# Patient Record
Sex: Female | Born: 1955 | Race: Black or African American | Hispanic: No | Marital: Married | Smoking: Never smoker
Health system: Southern US, Community
[De-identification: ages and names within clinical notes are randomized; demographics above are authoritative.]

## PROBLEM LIST (undated history)

## (undated) DIAGNOSIS — M549 Dorsalgia, unspecified: Secondary | ICD-10-CM

## (undated) DIAGNOSIS — G709 Myoneural disorder, unspecified: Secondary | ICD-10-CM

## (undated) DIAGNOSIS — E785 Hyperlipidemia, unspecified: Secondary | ICD-10-CM

## (undated) DIAGNOSIS — M25511 Pain in right shoulder: Secondary | ICD-10-CM

## (undated) DIAGNOSIS — E041 Nontoxic single thyroid nodule: Secondary | ICD-10-CM

## (undated) DIAGNOSIS — R011 Cardiac murmur, unspecified: Secondary | ICD-10-CM

## (undated) DIAGNOSIS — M542 Cervicalgia: Secondary | ICD-10-CM

## (undated) DIAGNOSIS — M25512 Pain in left shoulder: Secondary | ICD-10-CM

## (undated) DIAGNOSIS — E119 Type 2 diabetes mellitus without complications: Secondary | ICD-10-CM

## (undated) DIAGNOSIS — I10 Essential (primary) hypertension: Secondary | ICD-10-CM

## (undated) HISTORY — DX: Pain in right shoulder: M25.511

## (undated) HISTORY — DX: Myoneural disorder, unspecified: G70.9

## (undated) HISTORY — DX: Cardiac murmur, unspecified: R01.1

## (undated) HISTORY — DX: Pain in right shoulder: M25.512

## (undated) HISTORY — PX: CHOLECYSTECTOMY: SHX55

## (undated) HISTORY — DX: Hyperlipidemia, unspecified: E78.5

## (undated) HISTORY — PX: ABDOMINAL HYSTERECTOMY: SHX81

## (undated) HISTORY — DX: Nontoxic single thyroid nodule: E04.1

## (undated) HISTORY — DX: Cervicalgia: M54.2

## (undated) HISTORY — DX: Dorsalgia, unspecified: M54.9

---

## 1999-05-03 ENCOUNTER — Ambulatory Visit: Admission: RE | Admit: 1999-05-03 | Discharge: 1999-05-03 | Payer: Self-pay | Admitting: Pulmonary Disease

## 2003-04-24 ENCOUNTER — Encounter: Payer: Self-pay | Admitting: *Deleted

## 2003-04-24 ENCOUNTER — Emergency Department (HOSPITAL_COMMUNITY): Admission: EM | Admit: 2003-04-24 | Discharge: 2003-04-24 | Payer: Self-pay | Admitting: *Deleted

## 2003-05-25 ENCOUNTER — Encounter (HOSPITAL_COMMUNITY): Admission: RE | Admit: 2003-05-25 | Discharge: 2003-06-24 | Payer: Self-pay | Admitting: Family Medicine

## 2003-06-25 ENCOUNTER — Encounter (HOSPITAL_COMMUNITY): Admission: RE | Admit: 2003-06-25 | Discharge: 2003-07-25 | Payer: Self-pay | Admitting: Family Medicine

## 2006-10-13 ENCOUNTER — Emergency Department (HOSPITAL_COMMUNITY): Admission: EM | Admit: 2006-10-13 | Discharge: 2006-10-13 | Payer: Self-pay | Admitting: Physician Assistant

## 2007-02-19 ENCOUNTER — Emergency Department (HOSPITAL_COMMUNITY): Admission: EM | Admit: 2007-02-19 | Discharge: 2007-02-19 | Payer: Self-pay | Admitting: Emergency Medicine

## 2007-12-05 ENCOUNTER — Ambulatory Visit (HOSPITAL_COMMUNITY): Admission: RE | Admit: 2007-12-05 | Discharge: 2007-12-05 | Payer: Self-pay | Admitting: Internal Medicine

## 2007-12-28 ENCOUNTER — Emergency Department (HOSPITAL_COMMUNITY): Admission: EM | Admit: 2007-12-28 | Discharge: 2007-12-28 | Payer: Self-pay | Admitting: Emergency Medicine

## 2008-03-27 ENCOUNTER — Ambulatory Visit (HOSPITAL_COMMUNITY): Admission: RE | Admit: 2008-03-27 | Discharge: 2008-03-27 | Payer: Self-pay | Admitting: Internal Medicine

## 2008-12-23 ENCOUNTER — Ambulatory Visit (HOSPITAL_COMMUNITY): Admission: RE | Admit: 2008-12-23 | Discharge: 2008-12-23 | Payer: Self-pay | Admitting: Internal Medicine

## 2011-05-30 LAB — DIFFERENTIAL
Basophils Absolute: 0
Basophils Relative: 0
Eosinophils Absolute: 0.1
Eosinophils Relative: 1
Lymphocytes Relative: 15
Lymphs Abs: 1.5
Monocytes Absolute: 0.8
Monocytes Relative: 8
Neutro Abs: 7.7
Neutrophils Relative %: 76

## 2011-05-30 LAB — CBC
HCT: 36.1
Hemoglobin: 12.1
MCHC: 33.7
MCV: 82.3
Platelets: 240
RBC: 4.39
RDW: 14.7
WBC: 10.1

## 2011-05-30 LAB — BASIC METABOLIC PANEL
BUN: 8
CO2: 26
Calcium: 9.2
Chloride: 101
Creatinine, Ser: 0.86
GFR calc Af Amer: >60
GFR calc non Af Amer: >60
Glucose, Bld: 111 — ABNORMAL HIGH
Potassium: 3.5
Sodium: 134 — ABNORMAL LOW

## 2011-05-30 LAB — CULTURE, BLOOD (ROUTINE X 2)
Culture: NO GROWTH
Culture: NO GROWTH
Report Status: 4302009
Report Status: 4302009

## 2011-06-21 LAB — CBC
HCT: 37.7
Hemoglobin: 12.7
MCHC: 33.7
MCV: 79.7
Platelets: 308
RBC: 4.73
RDW: 14.6 — ABNORMAL HIGH
WBC: 8.3

## 2011-06-21 LAB — DIFFERENTIAL
Basophils Absolute: 0.1
Basophils Relative: 1
Eosinophils Absolute: 0.2
Eosinophils Relative: 2
Lymphocytes Relative: 35
Lymphs Abs: 2.9
Monocytes Absolute: 0.6
Monocytes Relative: 7
Neutro Abs: 4.5
Neutrophils Relative %: 55

## 2011-06-21 LAB — BASIC METABOLIC PANEL
BUN: 8
CO2: 29
Calcium: 9.5
Chloride: 105
Creatinine, Ser: 0.85
GFR calc Af Amer: >60
GFR calc non Af Amer: >60
Glucose, Bld: 68 — ABNORMAL LOW
Potassium: 3.8
Sodium: 140

## 2011-06-21 LAB — POCT CARDIAC MARKERS
CKMB, poc: 1.3
Myoglobin, poc: 85.4
Operator id: 217071
Troponin i, poc: <0.05

## 2011-06-21 LAB — BLOOD GAS, ARTERIAL
Acid-Base Excess: 2.2 — ABNORMAL HIGH
Bicarbonate: 26.1 — ABNORMAL HIGH
FIO2: 21
O2 Content: 21
O2 Saturation: 94.6
Patient temperature: 37
TCO2: 23.3
pCO2 arterial: 39.8
pH, Arterial: 7.432 — ABNORMAL HIGH
pO2, Arterial: 70.2 — ABNORMAL LOW

## 2011-06-21 LAB — D-DIMER, QUANTITATIVE: D-Dimer, Quant: 0.7 — ABNORMAL HIGH

## 2013-01-24 ENCOUNTER — Ambulatory Visit: Payer: Self-pay | Admitting: Physician Assistant

## 2013-05-14 ENCOUNTER — Other Ambulatory Visit (HOSPITAL_COMMUNITY): Payer: Self-pay | Admitting: Internal Medicine

## 2013-05-14 DIAGNOSIS — Z139 Encounter for screening, unspecified: Secondary | ICD-10-CM

## 2013-05-20 ENCOUNTER — Ambulatory Visit (HOSPITAL_COMMUNITY)
Admission: RE | Admit: 2013-05-20 | Discharge: 2013-05-20 | Disposition: A | Discharge status: Home / Self Care | Payer: BC Managed Care – PPO | Source: Ambulatory Visit | Attending: Internal Medicine | Admitting: Internal Medicine

## 2013-05-20 DIAGNOSIS — Z139 Encounter for screening, unspecified: Secondary | ICD-10-CM

## 2013-05-20 DIAGNOSIS — Z1231 Encounter for screening mammogram for malignant neoplasm of breast: Secondary | ICD-10-CM | POA: Insufficient documentation

## 2016-01-17 ENCOUNTER — Other Ambulatory Visit: Payer: Self-pay | Admitting: Registered Nurse

## 2016-01-17 DIAGNOSIS — Z1231 Encounter for screening mammogram for malignant neoplasm of breast: Secondary | ICD-10-CM

## 2016-02-04 ENCOUNTER — Ambulatory Visit (HOSPITAL_COMMUNITY)
Admission: RE | Admit: 2016-02-04 | Discharge: 2016-02-04 | Disposition: A | Discharge status: Home / Self Care | Payer: BLUE CROSS/BLUE SHIELD | Source: Ambulatory Visit | Attending: Registered Nurse | Admitting: Registered Nurse

## 2016-02-04 DIAGNOSIS — Z1231 Encounter for screening mammogram for malignant neoplasm of breast: Secondary | ICD-10-CM | POA: Insufficient documentation

## 2016-11-30 ENCOUNTER — Emergency Department (HOSPITAL_COMMUNITY): Payer: BLUE CROSS/BLUE SHIELD

## 2016-11-30 ENCOUNTER — Encounter (HOSPITAL_COMMUNITY): Payer: Self-pay

## 2016-11-30 ENCOUNTER — Emergency Department (HOSPITAL_COMMUNITY)
Admission: EM | Admit: 2016-11-30 | Discharge: 2016-11-30 | Disposition: A | Discharge status: Home / Self Care | Payer: BLUE CROSS/BLUE SHIELD | Attending: Emergency Medicine | Admitting: Emergency Medicine

## 2016-11-30 DIAGNOSIS — S161XXA Strain of muscle, fascia and tendon at neck level, initial encounter: Secondary | ICD-10-CM | POA: Diagnosis not present

## 2016-11-30 DIAGNOSIS — E119 Type 2 diabetes mellitus without complications: Secondary | ICD-10-CM | POA: Diagnosis not present

## 2016-11-30 DIAGNOSIS — Z7984 Long term (current) use of oral hypoglycemic drugs: Secondary | ICD-10-CM | POA: Diagnosis not present

## 2016-11-30 DIAGNOSIS — S0990XA Unspecified injury of head, initial encounter: Secondary | ICD-10-CM

## 2016-11-30 DIAGNOSIS — Y92410 Unspecified street and highway as the place of occurrence of the external cause: Secondary | ICD-10-CM | POA: Diagnosis not present

## 2016-11-30 DIAGNOSIS — I10 Essential (primary) hypertension: Secondary | ICD-10-CM | POA: Insufficient documentation

## 2016-11-30 DIAGNOSIS — Y939 Activity, unspecified: Secondary | ICD-10-CM | POA: Diagnosis not present

## 2016-11-30 DIAGNOSIS — Y999 Unspecified external cause status: Secondary | ICD-10-CM | POA: Insufficient documentation

## 2016-11-30 DIAGNOSIS — S199XXA Unspecified injury of neck, initial encounter: Secondary | ICD-10-CM | POA: Diagnosis present

## 2016-11-30 DIAGNOSIS — Z79899 Other long term (current) drug therapy: Secondary | ICD-10-CM | POA: Insufficient documentation

## 2016-11-30 HISTORY — DX: Essential (primary) hypertension: I10

## 2016-11-30 HISTORY — DX: Type 2 diabetes mellitus without complications: E11.9

## 2016-11-30 LAB — CBC WITH DIFFERENTIAL/PLATELET
Basophils Absolute: 0 10*3/uL (ref 0.0–0.1)
Basophils Relative: 0 %
Eosinophils Absolute: 0.2 10*3/uL (ref 0.0–0.7)
Eosinophils Relative: 2 %
HEMATOCRIT: 35.2 % — AB (ref 36.0–46.0)
Hemoglobin: 11.8 g/dL — ABNORMAL LOW (ref 12.0–15.0)
LYMPHS PCT: 31 %
Lymphs Abs: 2.6 10*3/uL (ref 0.7–4.0)
MCH: 27.4 pg (ref 26.0–34.0)
MCHC: 33.5 g/dL (ref 30.0–36.0)
MCV: 81.9 fL (ref 78.0–100.0)
MONO ABS: 0.8 10*3/uL (ref 0.1–1.0)
Monocytes Relative: 9 %
NEUTROS ABS: 4.7 10*3/uL (ref 1.7–7.7)
Neutrophils Relative %: 58 %
Platelets: 281 10*3/uL (ref 150–400)
RBC: 4.3 MIL/uL (ref 3.87–5.11)
RDW: 13.5 % (ref 11.5–15.5)
WBC: 8.3 10*3/uL (ref 4.0–10.5)

## 2016-11-30 LAB — BASIC METABOLIC PANEL
ANION GAP: 9 (ref 5–15)
BUN: 25 mg/dL — ABNORMAL HIGH (ref 6–20)
CALCIUM: 9.6 mg/dL (ref 8.9–10.3)
CHLORIDE: 100 mmol/L — AB (ref 101–111)
CO2: 26 mmol/L (ref 22–32)
Creatinine, Ser: 1.37 mg/dL — ABNORMAL HIGH (ref 0.44–1.00)
GFR calc non Af Amer: 40 mL/min — ABNORMAL LOW (ref >60)
GFR, EST AFRICAN AMERICAN: 47 mL/min — AB (ref >60)
Glucose, Bld: 212 mg/dL — ABNORMAL HIGH (ref 65–99)
Potassium: 3.3 mmol/L — ABNORMAL LOW (ref 3.5–5.1)
Sodium: 135 mmol/L (ref 135–145)

## 2016-11-30 MED ORDER — PROMETHAZINE HCL 25 MG PO TABS: 25.0000 mg | ORAL_TABLET | Freq: Four times a day (QID) | ORAL | 1 refills | Status: DC | PRN

## 2016-11-30 MED ORDER — HYDROMORPHONE HCL 1 MG/ML IJ SOLN
1.0000 mg | Freq: Once | INTRAMUSCULAR | Status: AC
  Administered 2016-11-30: 1 mg via INTRAVENOUS
  Filled 2016-11-30: qty 1

## 2016-11-30 MED ORDER — ONDANSETRON HCL 4 MG/2ML IJ SOLN
4.0000 mg | Freq: Once | INTRAMUSCULAR | Status: AC
  Administered 2016-11-30: 4 mg via INTRAVENOUS
  Filled 2016-11-30: qty 2

## 2016-11-30 MED ORDER — SODIUM CHLORIDE 0.9 % IV SOLN
INTRAVENOUS | Status: DC
  Administered 2016-11-30: 21:00:00 via INTRAVENOUS

## 2016-11-30 MED ORDER — HYDROCODONE-ACETAMINOPHEN 5-325 MG PO TABS: 1.0000 | ORAL_TABLET | Freq: Four times a day (QID) | ORAL | 0 refills | Status: DC | PRN

## 2016-11-30 MED ORDER — SODIUM CHLORIDE 0.9 % IV BOLUS (SEPSIS)
500.0000 mL | Freq: Once | INTRAVENOUS | Status: AC
  Administered 2016-11-30: 500 mL via INTRAVENOUS
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
## 2016-11-30 NOTE — Discharge Instructions (Signed)
Expect to be sore and stiff for the next 2 days. Take hydrocodone as needed for pain. Take Phenergan as needed for nausea and vomiting. If not improved over the next couple days make appointment follow-up with your doctor. Return for any new or worse symptoms.

## 2016-11-30 NOTE — ED Notes (Signed)
Vitals within normal limits. Patient is asking to remove C-collar at this time. Will check with EDP.
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
## 2016-12-19 ENCOUNTER — Ambulatory Visit (HOSPITAL_COMMUNITY): Payer: BLUE CROSS/BLUE SHIELD | Attending: Internal Medicine

## 2016-12-19 ENCOUNTER — Encounter (HOSPITAL_COMMUNITY): Payer: Self-pay

## 2016-12-19 DIAGNOSIS — M6281 Muscle weakness (generalized): Secondary | ICD-10-CM | POA: Insufficient documentation

## 2016-12-19 DIAGNOSIS — M546 Pain in thoracic spine: Secondary | ICD-10-CM | POA: Insufficient documentation

## 2016-12-19 DIAGNOSIS — R293 Abnormal posture: Secondary | ICD-10-CM | POA: Diagnosis present

## 2016-12-19 DIAGNOSIS — R29898 Other symptoms and signs involving the musculoskeletal system: Secondary | ICD-10-CM | POA: Diagnosis present
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
## 2016-12-19 NOTE — Patient Instructions (Signed)
  CERVICAL TOWEL ROTATION STRETCH  Hold the ends of a small folded bath towel and wrap it around your head and neck as shown. Place the towel on your face so as to minimize placing pressure on your jaw. Pressure should be placed on the side of your face/cheek bone.   Use your bottom most arm to anchor the towel in place. Use your top most arm to pull the towel to cause a gentle rotational stretch in your neck. Hold, then return to starting position and repeat.    Cervical Retraction Ronalee Red Tuck  While maintaining good posture, sit neutrally and place your pointer finger against your chin while looking straight ahead. Without moving the finger, draw your head directly backwards, keeping your gaze parallel to the floor. Return to neutral, bringing your chin back against your pointer finger. Make sure you are not going past the neutral neck position.   Perform 2-3 sets of 10 of each.

## 2016-12-26 ENCOUNTER — Ambulatory Visit (HOSPITAL_COMMUNITY): Payer: BLUE CROSS/BLUE SHIELD

## 2016-12-26 DIAGNOSIS — M546 Pain in thoracic spine: Secondary | ICD-10-CM | POA: Diagnosis not present

## 2016-12-26 DIAGNOSIS — M6281 Muscle weakness (generalized): Secondary | ICD-10-CM

## 2016-12-26 DIAGNOSIS — R293 Abnormal posture: Secondary | ICD-10-CM

## 2016-12-26 DIAGNOSIS — R29898 Other symptoms and signs involving the musculoskeletal system: Secondary | ICD-10-CM

## 2016-12-26 NOTE — Therapy (Signed)
[place], [place], [address] Phone: [phone]   Fax:  [phone]  Physical Therapy Treatment  Patient Details  Name: Maria Fisher MRN: [id number] Date of Birth: 04-Oct-1955 Referring Provider: Redmond School, MD  Encounter Date: 12/26/2016      PT End of Session - 12/26/16 0907    Visit Number 2   Number of Visits 8   Date for PT Re-Evaluation 01/16/17   Authorization Type BCBS Other   Authorization Time Period 12/19/16 to 01/16/17   PT Start Time 0859   PT Stop Time 0944   PT Time Calculation (min) 45 min   Activity Tolerance Patient limited by pain;Patient tolerated treatment well;No increased pain   Behavior During Therapy WFL for tasks assessed/performed      Past Medical History:  Diagnosis Date  . Diabetes mellitus without complication ([place])   . Hypertension     No past surgical history on file.  There were no vitals filed for this visit.      Subjective Assessment - 12/26/16 0856    Subjective Pt reports she has posterior cervical, Bil shoulder and LBP pain scale 5-6/10 sharp pain posterior neck and c/o headache today.  Reports compliance with HEP daily wihtout questions.   Pertinent History DM, HTN   Patient Stated Goals get back to her PLOF, and decreased pain   Currently in Pain? Yes   Pain Score 6    Pain Location Back   Pain Orientation Upper;Mid;Lower   Pain Descriptors / Indicators Sharp;Aching   Pain Type Acute pain   Pain Onset 1 to 4 weeks ago   Pain Frequency Intermittent   Aggravating Factors  sitting for a long timemoving around a lot, OH arm movement   Pain Relieving Factors pain pills, hot bath   Effect of Pain on Daily Activities increases                         [facility] Adult PT Treatment/Exercise - 12/26/16 0001      Posture/Postural Control   Posture/Postural Control Postural limitations   Postural Limitations Rounded Shoulders;Forward  head;Increased thoracic kyphosis   Posture Comments in sitting     Exercises   Exercises Neck     Neck Exercises: Seated   Neck Retraction 10 reps  2 sets 10x with mirror for form/tech   Neck Retraction Limitations Assistance requried for proper form     Neck Exercises: Supine   Neck Retraction 10 reps  2 sets     Manual Therapy   Manual Therapy Soft tissue mobilization   Manual therapy comments Manual complete separate rest of tx   Soft tissue mobilization periscapular/cervicall/ trial with suboccipital release (pt uanble to tolerate) initially supine then seated position for comfort     Neck Exercises: Stretches   Other Neck Stretches Cervical rotation with towel assistance 2x 30" (cueing for form required)                PT Education - 12/26/16 1147    Education provided Yes   Education Details Reviewed goals, assured compliance iwth HEP, copy of eval given to pt.     Person(s) Educated Patient   Methods Explanation;Demonstration;Handout   Comprehension Verbalized understanding;Returned demonstration;Verbal cues required;Tactile cues required;Need further instruction          PT Short Term Goals - 12/19/16 1540      PT SHORT TERM GOAL #1   Title Pt  will be independent with HEP to promote recovery and decrease risk of reinjury.   Time 2   Period Weeks   Status New     PT SHORT TERM GOAL #2   Title Pt will report 0/10 pain in her back with BUE OH movements to demonstrate improved overall function and maximize participation at home and in the community.   Time 2   Period Weeks   Status New     PT SHORT TERM GOAL #3   Title Pt will have improved sitting and walking tolerance by 50% with minimal to no reports of pain in order to maximize her participation at home, church, and in the community.   Time 2   Period Weeks   Status New           PT Long Term Goals - 12/19/16 1548      PT LONG TERM GOAL #1   Title Pt will have improved overall cervical  ROM by 15 deg or > with no reports of pain to demonstrate improved overall function and decreased pain.   Time 4   Period Weeks   Status New     PT LONG TERM GOAL #2   Title Pt will be able to attain and maintain proper sitting posture for 75% of the time or greater without cueing to decrease pain and demonstrate improved overall function.   Time 4   Period Weeks   Status New     PT LONG TERM GOAL #3   Title Pt will have improved overall BUE strength to at least 4+/5 and report no pain to demonstrate improved overall function in order to maximize pt's return to PLOF.   Time 4   Period Weeks   Status New               Plan - 12/26/16 0960    Clinical Impression Statement Reviewed goals, assured compliance with HEP and copy of eval given to pt.  Pt required increased education for proper form with HEP with  multimodal cueing  to improve form.  Pt improved cervical retraction in supine position with less shoulder musculature assistance required.  End of session with manual technqiues to reduce musculature tightness, pt with increased difficutly with supine semirecumbent position as c/o increased LBP, pt hypersensitive with difficutly tolerating light pressure.  Relaxating technqiues complete to assist with hypersensitivity.  No reports of increased pain at EOS.     Rehab Potential Good   PT Frequency 2x / week   PT Duration 4 weeks   PT Treatment/Interventions ADLs/Self Care Home Management;Canalith Repostioning;Moist Heat;Functional mobility training;Therapeutic activities;Therapeutic exercise;Balance training;Neuromuscular re-education;Patient/family education;Manual techniques;Passive range of motion;Dry needling;Energy conservation;Taping;Vestibular   PT Next Visit Plan Review form and compliance witht HEP next session; light soft tissue to periscapular/cervical and thoracic paraspinals/suboccipitals/UT region, cervical AAROM, cervical retractions in supine, cervical PROM, light  cervical isometrics   PT Home Exercise Plan eval: seated cervical retractions and AAROM into rotation with towel      Patient will benefit from skilled therapeutic intervention in order to improve the following deficits and impairments:  Decreased range of motion, Decreased strength, Hypomobility, Impaired flexibility, Increased muscle spasms, Improper body mechanics, Postural dysfunction, Obesity, Pain  Visit Diagnosis: Pain in thoracic spine  Muscle weakness (generalized)  Abnormal posture  Other symptoms and signs involving the musculoskeletal system     Problem List There are no active problems to display for this patient.  [address] LPTA; [facility] [phone]  Aldona Lento  12/26/2016, 11:49 AM  [place] [address] [place], [place], [address] Phone: [phone]   Fax:  [phone]  Name: Maria Fisher MRN: [id number] Date of Birth: [birth year]

## 2016-12-28 ENCOUNTER — Ambulatory Visit (HOSPITAL_COMMUNITY): Payer: BLUE CROSS/BLUE SHIELD

## 2016-12-28 DIAGNOSIS — R293 Abnormal posture: Secondary | ICD-10-CM

## 2016-12-28 DIAGNOSIS — M6281 Muscle weakness (generalized): Secondary | ICD-10-CM

## 2016-12-28 DIAGNOSIS — M546 Pain in thoracic spine: Secondary | ICD-10-CM | POA: Diagnosis not present

## 2016-12-28 DIAGNOSIS — R29898 Other symptoms and signs involving the musculoskeletal system: Secondary | ICD-10-CM

## 2016-12-28 NOTE — Therapy (Signed)
[place], [place], [address] Phone: [phone]   Fax:  [phone]  Physical Therapy Treatment  Patient Details  Name: Maria Fisher MRN: [id number] Date of Birth: [birth year] Referring Provider: Redmond School, MD  Encounter Date: 12/28/2016      PT End of Session - 12/28/16 1156    Visit Number 3   Number of Visits 8   Date for PT Re-Evaluation 01/16/17   Authorization Type BCBS Other   Authorization Time Period 12/19/16 to 01/16/17   PT Start Time 1118   PT Stop Time 1156   PT Time Calculation (min) 38 min   Activity Tolerance Patient limited by pain;Patient tolerated treatment well   Behavior During Therapy [facility] for tasks assessed/performed      Past Medical History:  Diagnosis Date  . Diabetes mellitus without complication ([place])   . Hypertension     No past surgical history on file.  There were no vitals filed for this visit.      Subjective Assessment - 12/28/16 1124    Subjective Pt reports she has some soreness posterior neck, Bil shoulders and LBP pain scale 4/10 nagging soreness.  No reports of headache today.     Pertinent History DM, HTN   Patient Stated Goals get back to her PLOF, and decreased pain   Currently in Pain? Yes   Pain Score 4    Pain Location Back  posterior neck, Bil shoulder and LBP   Pain Orientation Upper;Right;Left   Pain Descriptors / Indicators Nagging;Sore   Pain Type Acute pain   Pain Onset 1 to 4 weeks ago   Pain Frequency Intermittent   Aggravating Factors  sitting for a long time; moving around a lot, OH arm movement   Pain Relieving Factors pain pills, hot bath   Effect of Pain on Daily Activities increases                         [facility] Adult PT Treatment/Exercise - 12/28/16 0001      Posture/Postural Control   Posture/Postural Control Postural limitations   Postural Limitations Rounded Shoulders;Forward head;Increased thoracic  kyphosis   Posture Comments in sitting     Neck Exercises: Seated   Neck Retraction 10 reps   Neck Retraction Limitations Moderate visual and verbal cueing for mechanics   Cervical Rotation 10 reps   Lateral Flexion Right;Left;10 reps   W Back Limitations 5x   Other Seated Exercise scapular retraction 10x   Other Seated Exercise UE pullies for flexion and abduction x 93min     Manual Therapy   Manual Therapy Soft tissue mobilization   Manual therapy comments Manual complete separate rest of tx   Soft tissue mobilization periscapular/cervical/upper traps in seated position; trial in prone next session.  Unable to tolerate supine position     Neck Exercises: Stretches   Other Neck Stretches Cervical rotation with towel assistance 2x 30" (cueing for proper hand position                PT Education - 12/28/16 1346    Education provided Yes   Education Details Posture awareness/educated; towel roll for lower back in seated; encouraged to reduce load in purse          PT Short Term Goals - 12/19/16 1540      PT SHORT TERM GOAL #1   Title Pt will be independent with HEP to promote recovery and  decrease risk of reinjury.   Time 2   Period Weeks   Status New     PT SHORT TERM GOAL #2   Title Pt will report 0/10 pain in her back with BUE OH movements to demonstrate improved overall function and maximize participation at home and in the community.   Time 2   Period Weeks   Status New     PT SHORT TERM GOAL #3   Title Pt will have improved sitting and walking tolerance by 50% with minimal to no reports of pain in order to maximize her participation at home, church, and in the community.   Time 2   Period Weeks   Status New           PT Long Term Goals - 12/19/16 1548      PT LONG TERM GOAL #1   Title Pt will have improved overall cervical ROM by 15 deg or > with no reports of pain to demonstrate improved overall function and decreased pain.   Time 4   Period  Weeks   Status New     PT LONG TERM GOAL #2   Title Pt will be able to attain and maintain proper sitting posture for 75% of the time or greater without cueing to decrease pain and demonstrate improved overall function.   Time 4   Period Weeks   Status New     PT LONG TERM GOAL #3   Title Pt will have improved overall BUE strength to at least 4+/5 and report no pain to demonstrate improved overall function in order to maximize pt's return to PLOF.   Time 4   Period Weeks   Status New               Plan - 12/28/16 1157    Clinical Impression Statement Reviewed form with HEP with ability to position towel correctly wiht cervical rotation, cueing for proper hand placement; tactile cueing and demonstration to improve mechanics with cervical retraction.  Pt educated on importance of proper seated posture.  Towel roll to improve lumbar posture and pain control.  Added cervical mobility exercises and began posture strengthening with demonstration and tactile cueing for form/mechanics. EOS with manual to address cervical spasms with very light pressure, pt with increased hypersensitive to the touch.  Relaxatin techqniues complete to assist with hypersensitivity.  Discussion held with reducing load in purse for pain control.     Rehab Potential Good   PT Frequency 2x / week   PT Duration 4 weeks   PT Treatment/Interventions ADLs/Self Care Home Management;Canalith Repostioning;Moist Heat;Functional mobility training;Therapeutic activities;Therapeutic exercise;Balance training;Neuromuscular re-education;Patient/family education;Manual techniques;Passive range of motion;Dry needling;Energy conservation;Taping;Vestibular   PT Next Visit Plan Review form and compliance witht HEP next session; Begin decompression exercises to improve tolerance for supine position and posture strengthening.  Manual light soft tissue in prone position to periscapular/cervical and thoracic paraspinals/suboccipitals/UT  region, cervical AAROM, cervical retractions in supine, cervical PROM, light cervical isometrics   PT Home Exercise Plan eval: seated cervical retractions and AAROM into rotation with towel      Patient will benefit from skilled therapeutic intervention in order to improve the following deficits and impairments:  Decreased range of motion, Decreased strength, Hypomobility, Impaired flexibility, Increased muscle spasms, Improper body mechanics, Postural dysfunction, Obesity, Pain  Visit Diagnosis: Pain in thoracic spine  Muscle weakness (generalized)  Abnormal posture  Other symptoms and signs involving the musculoskeletal system     Problem List There are no  active problems to display for this patient.  [address] LPTA; [place] Aldona Lento 12/28/2016, 1:47 PM  [place] [address], [place], [address] Phone: [phone]   Fax:  [phone]  Name: Maria Fisher MRN: [id number] Date of Birth: [birth year]

## 2017-01-02 ENCOUNTER — Encounter (HOSPITAL_COMMUNITY): Payer: Self-pay

## 2017-01-02 ENCOUNTER — Ambulatory Visit (HOSPITAL_COMMUNITY): Payer: BLUE CROSS/BLUE SHIELD | Attending: Internal Medicine

## 2017-01-02 DIAGNOSIS — M6281 Muscle weakness (generalized): Secondary | ICD-10-CM | POA: Insufficient documentation

## 2017-01-02 DIAGNOSIS — M546 Pain in thoracic spine: Secondary | ICD-10-CM | POA: Diagnosis present

## 2017-01-02 DIAGNOSIS — R293 Abnormal posture: Secondary | ICD-10-CM

## 2017-01-02 DIAGNOSIS — R29898 Other symptoms and signs involving the musculoskeletal system: Secondary | ICD-10-CM | POA: Diagnosis present

## 2017-01-02 NOTE — Therapy (Signed)
[place], [place], [address] Phone: [phone]   Fax:  [phone]  Physical Therapy Treatment  Patient Details  Name: Maria Fisher MRN: [id number] Date of Birth: 03/29/1956 Referring Provider: Redmond School, MD  Encounter Date: 01/02/2017      PT End of Session - 01/02/17 1028    Visit Number 4   Number of Visits 8   Date for PT Re-Evaluation 01/16/17   Authorization Type BCBS Other   Authorization Time Period 12/19/16 to 01/16/17   PT Start Time 1028   PT Stop Time 1110   PT Time Calculation (min) 42 min   Activity Tolerance Patient limited by pain;Patient tolerated treatment well   Behavior During Therapy [facility] for tasks assessed/performed      Past Medical History:  Diagnosis Date  . Diabetes mellitus without complication ([place])   . Hypertension     History reviewed. No pertinent surgical history.  There were no vitals filed for this visit.      Subjective Assessment - 01/02/17 1028    Subjective Pt states she is feeling a little bit better. She is still having some neck and bil shoulder pain.   Pertinent History DM, HTN   Patient Stated Goals get back to her PLOF, and decreased pain   Currently in Pain? Yes   Pain Score 4    Pain Location Back   Pain Orientation Upper;Right;Left   Pain Descriptors / Indicators Nagging;Sore   Pain Type Acute pain   Pain Onset 1 to 4 weeks ago   Pain Frequency Intermittent   Aggravating Factors  sitting for a long time, moving around a lot, OH arm movement   Pain Relieving Factors pain pills, hot bath   Effect of Pain on Daily Activities increases              [facility] Adult PT Treatment/Exercise - 01/02/17 0001      Neck Exercises: Seated   Other Seated Exercise bil UT stretch in chair 3x30 sec each; bil levator scap stretch in chair 3x30 seconds each     Neck Exercises: Supine   Neck Retraction 10 reps     Manual Therapy   Manual Therapy Soft  tissue mobilization   Manual therapy comments Manual complete separate rest of tx   Soft tissue mobilization bil UT region and upper thoracic paraspinals x 12 mins total; tried in prone initially but pt very uncomfortable and had increased LBP so performed rest of manual with pt seated -- trial in sidelying next session            PT Education - 01/02/17 1112    Education provided Yes   Education Details proper technique with stretching, added stretching to HEP   Person(s) Educated Patient   Methods Explanation;Demonstration;Handout   Comprehension Verbalized understanding;Returned demonstration;Need further instruction;Verbal cues required;Tactile cues required          PT Short Term Goals - 12/19/16 1540      PT SHORT TERM GOAL #1   Title Pt will be independent with HEP to promote recovery and decrease risk of reinjury.   Time 2   Period Weeks   Status New     PT SHORT TERM GOAL #2   Title Pt will report 0/10 pain in her back with BUE OH movements to demonstrate improved overall function and maximize participation at home and in the community.   Time 2   Period Weeks   Status New  PT SHORT TERM GOAL #3   Title Pt will have improved sitting and walking tolerance by 50% with minimal to no reports of pain in order to maximize her participation at home, church, and in the community.   Time 2   Period Weeks   Status New           PT Long Term Goals - 12/19/16 1548      PT LONG TERM GOAL #1   Title Pt will have improved overall cervical ROM by 15 deg or > with no reports of pain to demonstrate improved overall function and decreased pain.   Time 4   Period Weeks   Status New     PT LONG TERM GOAL #2   Title Pt will be able to attain and maintain proper sitting posture for 75% of the time or greater without cueing to decrease pain and demonstrate improved overall function.   Time 4   Period Weeks   Status New     PT LONG TERM GOAL #3   Title Pt will have  improved overall BUE strength to at least 4+/5 and report no pain to demonstrate improved overall function in order to maximize pt's return to PLOF.   Time 4   Period Weeks   Status New               Plan - 01/02/17 1113    Clinical Impression Statement Began session with manual soft tissue to bil UT region and upper thoracic paraspinals while pt in prone. Pt had increased LBP with prone positioning so finished manual therapy with pt sitting in chair. Had pt perform UT and levator scap stretching while seated and she required cues for proper technique. Pt tolerated supine position better today with wedge under BLE so PT was able to perform manual therapy on pt's bil SCM's. She was hyperalgesic to light touch again but with cueing to relax and position her neck to put the respctive SCM on slack, pt tolerated manual better. She reported decreasing pain with prolonged palpation and reported an overall decrease in pain at EOS, though she still rated it as a 4/10.    Rehab Potential Good   PT Frequency 2x / week   PT Duration 4 weeks   PT Treatment/Interventions ADLs/Self Care Home Management;Canalith Repostioning;Moist Heat;Functional mobility training;Therapeutic activities;Therapeutic exercise;Balance training;Neuromuscular re-education;Patient/family education;Manual techniques;Passive range of motion;Dry needling;Energy conservation;Taping;Vestibular   PT Next Visit Plan Review form and compliance witht HEP next session; instruct on how to perform decompression exercises to improve tolerance for supine position and posture strengthening.  Manual light soft tissue in prone position to periscapular/cervical and thoracic paraspinals/suboccipitals/UT region, cervical AAROM, cervical retractions in supine, cervical PROM, light cervical isometrics; continue manual therapy to SCMs in supine with wedge   PT Home Exercise Plan eval: seated cervical retractions and AAROM into rotation with towel    Consulted and Agree with Plan of Care Patient      Patient will benefit from skilled therapeutic intervention in order to improve the following deficits and impairments:  Decreased range of motion, Decreased strength, Hypomobility, Impaired flexibility, Increased muscle spasms, Improper body mechanics, Postural dysfunction, Obesity, Pain  Visit Diagnosis: Pain in thoracic spine  Muscle weakness (generalized)  Abnormal posture  Other symptoms and signs involving the musculoskeletal system     Problem List There are no active problems to display for this patient.    Geraldine Solar PT, [place] [phone]  [address] [place], [place], [address] Phone: [phone]   Fax:  [phone]  Name: Maria Fisher MRN: [id number] Date of Birth: 1956/01/07

## 2017-01-02 NOTE — Patient Instructions (Signed)
  UPPER TRAP STRETCH - HOLDING CHAIR  While sitting in a chair, hold the seat with one hand and bend your head towards the opposite side for a gentle stretch to the side of the neck.   Levator Scap Stretch   Place hand on back of head look toward armpit and gently pull until stretch is felt.   Perform each stretch 1x/day, do 3-5 stretches on each side and hold for 30 seconds each

## 2017-01-04 ENCOUNTER — Ambulatory Visit (HOSPITAL_COMMUNITY): Payer: BLUE CROSS/BLUE SHIELD

## 2017-01-04 DIAGNOSIS — R293 Abnormal posture: Secondary | ICD-10-CM

## 2017-01-04 DIAGNOSIS — M546 Pain in thoracic spine: Secondary | ICD-10-CM

## 2017-01-04 DIAGNOSIS — R29898 Other symptoms and signs involving the musculoskeletal system: Secondary | ICD-10-CM

## 2017-01-04 DIAGNOSIS — M6281 Muscle weakness (generalized): Secondary | ICD-10-CM

## 2017-01-04 NOTE — Therapy (Signed)
[place], [place], [address] Phone: [phone]   Fax:  [phone]  Physical Therapy Treatment  Patient Details  Name: Maria Fisher MRN: [id number] Date of Birth: [birth year] Referring Provider: Redmond School, MD  Encounter Date: 01/04/2017      PT End of Session - 01/04/17 1045    Visit Number 5   Number of Visits 8   Date for PT Re-Evaluation 01/16/17   Authorization Type BCBS Other   Authorization Time Period 12/19/16 to 01/16/17   PT Start Time 1037   PT Stop Time 1116   PT Time Calculation (min) 39 min   Activity Tolerance Patient limited by pain;Patient tolerated treatment well   Behavior During Therapy [facility] for tasks assessed/performed      Past Medical History:  Diagnosis Date  . Diabetes mellitus without complication ([place])   . Hypertension     No past surgical history on file.  There were no vitals filed for this visit.      Subjective Assessment - 01/04/17 1030    Subjective Pt feels she slept wrong last night, increased pain neck and Bil shoulders pain scale 5/10.   Pertinent History DM, HTN   Patient Stated Goals get back to her PLOF, and decreased pain   Currently in Pain? Yes   Pain Score 5    Pain Location Back   Pain Orientation Upper;Right;Left   Pain Descriptors / Indicators Sharp   Pain Type Acute pain   Pain Onset 1 to 4 weeks ago   Pain Frequency Intermittent   Aggravating Factors  sitting for a long time, moving around a lot, OH arm movement   Pain Relieving Factors pain pills, hot bath   Effect of Pain on Daily Activities increases                         [facility] Adult PT Treatment/Exercise - 01/04/17 0001      Bed Mobility   Bed Mobility Sit to Sidelying Left   Sit to Sidelying Left 5: Supervision   Sit to Sidelying Left Details (indicate cue type and reason) Cueing for proper bed mobility mechanics     Neck Exercises: Supine   Other Supine  Exercise Decompression exercises 1-5     Manual Therapy   Manual Therapy Soft tissue mobilization   Manual therapy comments Manual complete separate rest of tx   Soft tissue mobilization Bil UT, levator scapular, upper thoracic paraspinals, scalenes in seated position     Neck Exercises: Stretches   Upper Trapezius Stretch 2 reps;30 seconds   Levator Stretch 2 reps;30 seconds                  PT Short Term Goals - 12/19/16 1540      PT SHORT TERM GOAL #1   Title Pt will be independent with HEP to promote recovery and decrease risk of reinjury.   Time 2   Period Weeks   Status New     PT SHORT TERM GOAL #2   Title Pt will report 0/10 pain in her back with BUE OH movements to demonstrate improved overall function and maximize participation at home and in the community.   Time 2   Period Weeks   Status New     PT SHORT TERM GOAL #3   Title Pt will have improved sitting and walking tolerance by 50% with minimal to no reports of pain in  order to maximize her participation at home, church, and in the community.   Time 2   Period Weeks   Status New           PT Long Term Goals - 12/19/16 1548      PT LONG TERM GOAL #1   Title Pt will have improved overall cervical ROM by 15 deg or > with no reports of pain to demonstrate improved overall function and decreased pain.   Time 4   Period Weeks   Status New     PT LONG TERM GOAL #2   Title Pt will be able to attain and maintain proper sitting posture for 75% of the time or greater without cueing to decrease pain and demonstrate improved overall function.   Time 4   Period Weeks   Status New     PT LONG TERM GOAL #3   Title Pt will have improved overall BUE strength to at least 4+/5 and report no pain to demonstrate improved overall function in order to maximize pt's return to PLOF.   Time 4   Period Weeks   Status New               Plan - 01/04/17 1048    Clinical Impression Statement Reviewed form  with new stretches with verbal and visual cueing to improve form, pt able to verbalize appropriate hold times with stretches.  Began decompression exercises to improve tolerance with supine positon and gentle postural strengthening.  Pt educated on importance of proper bed mobility, able to demonstrate appropriate mechanics following instructions.  EOS with manual to address cervical soft tissue restrictions, seated position most tolerable for pt during manual.  Pt hyperalgesic to light touch during manual to UT, scalenes, levator scapula and paraspinals; cueing to improved deep breathing and relaxation to assist.     Rehab Potential Good   PT Duration 4 weeks   PT Treatment/Interventions ADLs/Self Care Home Management;Canalith Repostioning;Moist Heat;Functional mobility training;Therapeutic activities;Therapeutic exercise;Balance training;Neuromuscular re-education;Patient/family education;Manual techniques;Passive range of motion;Dry needling;Energy conservation;Taping;Vestibular   PT Next Visit Plan Review form for proper cervical retraction and form with HEP stretches next session.  Continue decompression exercises, give as additional HEP if able to perform correcttly and progress to theraband when ready.  Perform manual light soft tissue to SCM/periscapular/cervical and thoracic paraspinals/suboccipitals/UT region in supine position next session with wedge for comfort.   PT Home Exercise Plan eval: seated cervical retractions and AAROM into rotation with towel; levator and UT stretches      Patient will benefit from skilled therapeutic intervention in order to improve the following deficits and impairments:  Decreased range of motion, Decreased strength, Hypomobility, Impaired flexibility, Increased muscle spasms, Improper body mechanics, Postural dysfunction, Obesity, Pain  Visit Diagnosis: Pain in thoracic spine  Muscle weakness (generalized)  Abnormal posture  Other symptoms and signs  involving the musculoskeletal system     Problem List There are no active problems to display for this patient.  [address] LPTA; [place]  Aldona Lento 01/04/2017, 12:23 PM  [place] [address] [place], [place], [address] Phone: [phone]   Fax:  [phone]  Name: ELODIA HAVILAND MRN: [id number] Date of Birth: [birth year]

## 2017-01-09 ENCOUNTER — Ambulatory Visit (HOSPITAL_COMMUNITY): Payer: BLUE CROSS/BLUE SHIELD

## 2017-01-09 ENCOUNTER — Encounter (HOSPITAL_COMMUNITY): Payer: Self-pay

## 2017-01-09 DIAGNOSIS — M6281 Muscle weakness (generalized): Secondary | ICD-10-CM

## 2017-01-09 DIAGNOSIS — R29898 Other symptoms and signs involving the musculoskeletal system: Secondary | ICD-10-CM

## 2017-01-09 DIAGNOSIS — M546 Pain in thoracic spine: Secondary | ICD-10-CM

## 2017-01-09 DIAGNOSIS — R293 Abnormal posture: Secondary | ICD-10-CM

## 2017-01-09 NOTE — Therapy (Signed)
[place], [place], [address] Phone: [phone]   Fax:  [phone]  Physical Therapy Treatment  Patient Details  Name: Maria Fisher MRN: [id number] Date of Birth: August 09, 1956 Referring Provider: Redmond School, MD  Encounter Date: 01/09/2017      PT End of Session - 01/09/17 1037    Visit Number 6   Number of Visits 8   Date for PT Re-Evaluation 01/16/17   Authorization Type BCBS Other   Authorization Time Period 12/19/16 to 01/16/17   PT Start Time 1033   PT Stop Time 1114   PT Time Calculation (min) 41 min   Activity Tolerance Patient limited by pain;Patient tolerated treatment well   Behavior During Therapy [facility] for tasks assessed/performed      Past Medical History:  Diagnosis Date  . Diabetes mellitus without complication ([place])   . Hypertension     History reviewed. No pertinent surgical history.  There were no vitals filed for this visit.      Subjective Assessment - 01/09/17 1035    Subjective Pt states that she feels pretty good today, she rates it as a 3/10 currently. She states that she had some pain over the weekend but not as bad as usual.    Pertinent History DM, HTN   Patient Stated Goals get back to her PLOF, and decreased pain   Currently in Pain? Yes   Pain Score 3    Pain Location Back   Pain Orientation Right;Left;Upper   Pain Descriptors / Indicators Tightness;Aching   Pain Type Acute pain   Pain Onset 1 to 4 weeks ago   Pain Frequency Intermittent   Aggravating Factors  sitting for a long time, moving around a lot, OH arm movement   Pain Relieving Factors pain pills, hot bath   Effect of Pain on Daily Activities increases            [facility] Adult PT Treatment/Exercise - 01/09/17 0001      Neck Exercises: Supine   Shoulder Flexion Both;10 reps   Shoulder Flexion Limitations supine with wand while maintaining cervical retraction; pt reported no neck pain or shoulder  pain throughout   Other Supine Exercise Decompression exercises 1-5     Manual Therapy   Manual Therapy Soft tissue mobilization;Passive ROM   Manual therapy comments Manual complete separate rest of tx   Soft tissue mobilization bil UT and levator scap in sitting   Passive ROM supine cervical rotation and lateral flexion bilaterally x 10 each way, min cues to relax and pt reported no pain with PROM     Neck Exercises: Stretches   Upper Trapezius Stretch 3 reps;30 seconds  with moist heat pack applied   Levator Stretch 3 reps;30 seconds  with moist heat pack applied            PT Education - 01/09/17 1123    Education provided Yes   Education Details continue stretching at home but perform with heat pack to promote relaxation, add decompression exercises to HEP, cues to relax during PROM   Person(s) Educated Patient   Methods Explanation;Demonstration;Handout   Comprehension Verbalized understanding;Returned demonstration          PT Short Term Goals - 12/19/16 1540      PT SHORT TERM GOAL #1   Title Pt will be independent with HEP to promote recovery and decrease risk of reinjury.   Time 2   Period Weeks   Status New  PT SHORT TERM GOAL #2   Title Pt will report 0/10 pain in her back with BUE OH movements to demonstrate improved overall function and maximize participation at home and in the community.   Time 2   Period Weeks   Status New     PT SHORT TERM GOAL #3   Title Pt will have improved sitting and walking tolerance by 50% with minimal to no reports of pain in order to maximize her participation at home, church, and in the community.   Time 2   Period Weeks   Status New           PT Long Term Goals - 12/19/16 1548      PT LONG TERM GOAL #1   Title Pt will have improved overall cervical ROM by 15 deg or > with no reports of pain to demonstrate improved overall function and decreased pain.   Time 4   Period Weeks   Status New     PT LONG TERM  GOAL #2   Title Pt will be able to attain and maintain proper sitting posture for 75% of the time or greater without cueing to decrease pain and demonstrate improved overall function.   Time 4   Period Weeks   Status New     PT LONG TERM GOAL #3   Title Pt will have improved overall BUE strength to at least 4+/5 and report no pain to demonstrate improved overall function in order to maximize pt's return to PLOF.   Time 4   Period Weeks   Status New               Plan - 01/09/17 1125    Clinical Impression Statement Began session with pt performing UT and levator scap stretches with moist heat pack applied. Pt responded very well to this and had improved tolerance to stretches but sh still required cueing for proper technique throughout. Pt continues with increased soft tissue restrictions of bil UT, levator scap and paraspinals. Performed and educated pt to add decompression exercises to HEP. Pt had no neck pain with supine shoulder flexion with wand while maintaining cervical retraction. Pt tolerated PROM well and had no pain when she was fully relaxed. Continue POC as planned.   Rehab Potential Good   PT Duration 4 weeks   PT Treatment/Interventions ADLs/Self Care Home Management;Canalith Repostioning;Moist Heat;Functional mobility training;Therapeutic activities;Therapeutic exercise;Balance training;Neuromuscular re-education;Patient/family education;Manual techniques;Passive range of motion;Dry needling;Energy conservation;Taping;Vestibular   PT Next Visit Plan Continue stretching with moist heat pack, review how decompression exercises went as part of HEP; Perform manual light soft tissue to SCM/periscapular/cervical and thoracic paraspinals/suboccipitals/UT region in most comfortable position for pt (likely sitting); continue cervical PROM (ensure pt is relaxed and not actively moving neck) and shoulder AROM with cervical retraction; postural strengthening with theraband as tolerated    PT Home Exercise Plan eval: seated cervical retractions and AAROM into rotation with towel; levator and UT stretches; 5/8: decompression exercises, perform UT and levator stretching with heat pack   Consulted and Agree with Plan of Care Patient      Patient will benefit from skilled therapeutic intervention in order to improve the following deficits and impairments:  Decreased range of motion, Decreased strength, Hypomobility, Impaired flexibility, Increased muscle spasms, Improper body mechanics, Postural dysfunction, Obesity, Pain  Visit Diagnosis: Pain in thoracic spine  Muscle weakness (generalized)  Abnormal posture  Other symptoms and signs involving the musculoskeletal system     Problem List There are  no active problems to display for this patient.    Geraldine Solar PT, [place] [address] [place], [place], [address] Phone: [phone]   Fax:  [phone]  Name: Maria Fisher MRN: [id number] Date of Birth: 05/13/1956

## 2017-01-11 ENCOUNTER — Ambulatory Visit (HOSPITAL_COMMUNITY): Payer: BLUE CROSS/BLUE SHIELD

## 2017-01-11 ENCOUNTER — Encounter (HOSPITAL_COMMUNITY): Payer: Self-pay

## 2017-01-11 DIAGNOSIS — M546 Pain in thoracic spine: Secondary | ICD-10-CM | POA: Diagnosis not present

## 2017-01-11 DIAGNOSIS — M6281 Muscle weakness (generalized): Secondary | ICD-10-CM

## 2017-01-11 DIAGNOSIS — R29898 Other symptoms and signs involving the musculoskeletal system: Secondary | ICD-10-CM

## 2017-01-11 DIAGNOSIS — R293 Abnormal posture: Secondary | ICD-10-CM

## 2017-01-11 NOTE — Patient Instructions (Signed)
  CERVICAL ROTATION  Turn your head towards the side, then return back to looking straight ahead.  Make sure to do your chin tuck first and then look Left and Right while maintaining the chin tuck.

## 2017-01-11 NOTE — Therapy (Signed)
[place], [place], [address] Phone: [phone]   Fax:  [phone]  Physical Therapy Treatment  Patient Details  Name: Maria Fisher MRN: [id number] Date of Birth: 04/19/1956 Referring Provider: Redmond School, MD  Encounter Date: 01/11/2017      PT End of Session - 01/11/17 0954    Visit Number 7   Number of Visits 8   Date for PT Re-Evaluation 01/16/17   Authorization Type BCBS Other   Authorization Time Period 12/19/16 to 01/16/17   PT Start Time 0949   PT Stop Time 1031   PT Time Calculation (min) 42 min   Activity Tolerance Patient limited by pain;Patient tolerated treatment well   Behavior During Therapy [facility] for tasks assessed/performed      Past Medical History:  Diagnosis Date  . Diabetes mellitus without complication ([place])   . Hypertension     History reviewed. No pertinent surgical history.  There were no vitals filed for this visit.      Subjective Assessment - 01/11/17 0950    Subjective Pt states she was a little sore following last session. She states it was hurting her so bad yesterday that she almost cried. She woke up this morning and used heat and it feels a lot better. Still rates pain at 3/10.   Pertinent History DM, HTN   Patient Stated Goals get back to her PLOF, and decreased pain   Currently in Pain? Yes   Pain Score 3    Pain Location Back   Pain Orientation Right;Left;Upper   Pain Descriptors / Indicators Aching   Pain Type Acute pain   Pain Onset 1 to 4 weeks ago   Pain Frequency Intermittent   Aggravating Factors  sitting for a long time, moving around a lot, OH arm movement   Pain Relieving Factors pain pills, hot bath   Effect of Pain on Daily Activities increases              [facility] Adult PT Treatment/Exercise - 01/11/17 0001      Neck Exercises: Seated   Neck Retraction 10 reps   Neck Retraction Limitations 2 sets, improved technique but still required  min cues   Shoulder Rolls Backwards;20 reps  some discomfort noted with rolls   Other Seated Exercise scap retraction and low rows in sitting maintaining cervical retraction 2x10 with RTB     Neck Exercises: Stretches   Upper Trapezius Stretch 3 reps;30 seconds  with heat pack   Levator Stretch 3 reps;30 seconds  with heat pack             PT Education - 01/11/17 1106    Education provided Yes   Education Details educated on pain, exercise technique, updated HEP, educated to sleep with towel roll in pillow case   Person(s) Educated Patient   Methods Explanation;Demonstration;Handout   Comprehension Verbalized understanding;Returned demonstration          PT Short Term Goals - 12/19/16 1540      PT SHORT TERM GOAL #1   Title Pt will be independent with HEP to promote recovery and decrease risk of reinjury.   Time 2   Period Weeks   Status New     PT SHORT TERM GOAL #2   Title Pt will report 0/10 pain in her back with BUE OH movements to demonstrate improved overall function and maximize participation at home and in the community.   Time 2   Period  Weeks   Status New     PT SHORT TERM GOAL #3   Title Pt will have improved sitting and walking tolerance by 50% with minimal to no reports of pain in order to maximize her participation at home, church, and in the community.   Time 2   Period Weeks   Status New           PT Long Term Goals - 12/19/16 1548      PT LONG TERM GOAL #1   Title Pt will have improved overall cervical ROM by 15 deg or > with no reports of pain to demonstrate improved overall function and decreased pain.   Time 4   Period Weeks   Status New     PT LONG TERM GOAL #2   Title Pt will be able to attain and maintain proper sitting posture for 75% of the time or greater without cueing to decrease pain and demonstrate improved overall function.   Time 4   Period Weeks   Status New     PT LONG TERM GOAL #3   Title Pt will have improved  overall BUE strength to at least 4+/5 and report no pain to demonstrate improved overall function in order to maximize pt's return to PLOF.   Time 4   Period Weeks   Status New               Plan - 01/11/17 1107    Clinical Impression Statement Began session with stretching while heat pack applied; pt still requires cues for proper technique throughout but pt reported improved tolerance to stretching. She had improved cervical retraction while sitting but did require cues to decrease flexion/extension and maximize retraction. She had no pain with cervical rotation while maintaining cervical retraction and had a significant improvement in her ROM with R rotation while in retraction. Postural strengthening went well with no pain. Reassess next session and determine future POC.   Rehab Potential Good   PT Duration 4 weeks   PT Treatment/Interventions ADLs/Self Care Home Management;Canalith Repostioning;Moist Heat;Functional mobility training;Therapeutic activities;Therapeutic exercise;Balance training;Neuromuscular re-education;Patient/family education;Manual techniques;Passive range of motion;Dry needling;Energy conservation;Taping;Vestibular   PT Next Visit Plan reassess next visit    PT Home Exercise Plan eval: seated cervical retractions and AAROM into rotation with towel; levator and UT stretches; 5/8: decompression exercises, perform UT and levator stretching with heat pack   Consulted and Agree with Plan of Care Patient      Patient will benefit from skilled therapeutic intervention in order to improve the following deficits and impairments:  Decreased range of motion, Decreased strength, Hypomobility, Impaired flexibility, Increased muscle spasms, Improper body mechanics, Postural dysfunction, Obesity, Pain  Visit Diagnosis: Pain in thoracic spine  Muscle weakness (generalized)  Abnormal posture  Other symptoms and signs involving the musculoskeletal system     Problem  List There are no active problems to display for this patient.    Geraldine Solar PT, [place] [address] [place], [place], [address] Phone: [phone]   Fax:  [phone]  Name: Maria Fisher MRN: [id number] Date of Birth: 28-Apr-1956

## 2017-01-16 ENCOUNTER — Ambulatory Visit (HOSPITAL_COMMUNITY): Payer: BLUE CROSS/BLUE SHIELD

## 2017-01-16 DIAGNOSIS — M546 Pain in thoracic spine: Secondary | ICD-10-CM | POA: Diagnosis not present

## 2017-01-16 DIAGNOSIS — M6281 Muscle weakness (generalized): Secondary | ICD-10-CM

## 2017-01-16 DIAGNOSIS — R293 Abnormal posture: Secondary | ICD-10-CM

## 2017-01-16 DIAGNOSIS — R29898 Other symptoms and signs involving the musculoskeletal system: Secondary | ICD-10-CM

## 2017-01-16 NOTE — Therapy (Addendum)
[facility] [place], [place], [address] Phone: [phone]   Fax:  [phone]  Physical Therapy Treatment  Patient Details  Name: Maria Fisher MRN: [id number] Date of Birth: 06/14/1956 Referring Provider: Redmond School, MD   Encounter Date: 01/16/2017      PT End of Session - 01/16/17 1023    Visit Number 8   Number of Visits 8   Date for PT Re-Evaluation 01/16/17   Authorization Type BCBS Other   Authorization Time Period 12/19/16 to 01/16/17   PT Start Time 0948   PT Stop Time 1023   PT Time Calculation (min) 35 min   Activity Tolerance Patient tolerated treatment well;No increased pain   Behavior During Therapy WFL for tasks assessed/performed      Past Medical History:  Diagnosis Date  . Diabetes mellitus without complication ([place])   . Hypertension     No past surgical history on file.  There were no vitals filed for this visit.      Subjective Assessment - 01/16/17 0950    Subjective Pt reports she continues to have some mild pain in the Rt neck adn shoulder. HEP is compliant. Pt notes progress since starting PT, like bed mobility and self management of symptoms.    Limitations Sitting;House hold activities;Lifting   How long can you sit comfortably? 15-20 minutes (15 mins on 4/17)   How long can you stand comfortably? no issues (no change since eval)    How long can you walk comfortably? 15 minutes (15 mins on 4/17)   Currently in Pain? Yes   Pain Score 0-No pain   Pain Location --  Right neck and shoulder.             [facility] PT Assessment - 01/16/17 0001      Assessment   Medical Diagnosis Muscle pain, back and shoulders   Referring Provider Redmond School, MD    Onset Date/Surgical Date 11/30/16   Next MD Visit unsure about visit with [facility]; seeing neurology next week.      Balance Screen   Has the patient fallen in the past 6 months No   Has the patient had a decrease in activity  level because of a fear of falling?  No   Is the patient reluctant to leave their home because of a fear of falling?  No     Sensation   Light Touch --  Intact at eval; no new complaints     AROM   Overall AROM Comments BUE shoulder flexion and ABD restricted by 30-40%; painful at end range flexion   Cervical Flexion 45 degrees (33 at eval)    Cervical Extension 55 degrees (31 at eval)    Cervical - Right Side Bend 31 degrees (22 degrees at eval)   pain free   Cervical - Left Side Bend 29 degrees (23 degrees at eval)  painful   Cervical - Right Rotation 50 (64 degrees at eval)   painful   Cervical - Left Rotation 60 (69 degrees at eval)     Strength   Overall Strength --  bilat shoulders grossly unchanged since eval    Right Shoulder Flexion 4-/5   Right Shoulder ABduction 4-/5   Right Shoulder Internal Rotation 4-/5   Right Shoulder External Rotation 4-/5   Left Shoulder Flexion 4-/5   Left Shoulder ABduction 4-/5   Left Shoulder Internal Rotation 4-/5   Left Shoulder External Rotation 4-/5   Right/Left  Elbow --  mild gross improvement since eval   Right Elbow Flexion 5/5   Right Elbow Extension 4-/5   Left Elbow Flexion 5/5   Left Elbow Extension 5/5   Right Wrist Flexion 4-/5   Right Wrist Extension 4+/5   Left Wrist Flexion 4-/5   Left Wrist Extension 4-/5   Right Hand Gross Grasp Impaired   Right Hand Grip (lbs) 44lbs   improved since visit; (20% below age matched norm)    Left Hand Gross Grasp Functional   Left Hand Grip (lbs) 52 lbs                               PT Short Term Goals - 01/16/17 1013      PT SHORT TERM GOAL #1   Title Pt will be independent with HEP to promote recovery and decrease risk of reinjury.   Time 2   Period Weeks   Status Achieved     PT SHORT TERM GOAL #2   Title Pt will report 0/10 pain in her back with BUE OH movements to demonstrate improved overall function and maximize participation at home and in the  community.   Baseline able to perform all self care with minimal pain, minimal increased time; but not painfree   Time 2   Period Weeks   Status Partially Met     PT SHORT TERM GOAL #3   Title Pt will have improved sitting and walking tolerance by 50% with minimal to no reports of pain in order to maximize her participation at home, church, and in the community.   Time 2   Period Weeks   Status Not Met           PT Long Term Goals - 01/16/17 1015      PT LONG TERM GOAL #1   Title Pt will have improved overall cervical ROM by 15 deg or > with no reports of pain to demonstrate improved overall function and decreased pain.   Baseline somne improvement, but grossly with minimal change and continued pain    Time 4   Period Weeks   Status Not Met     PT LONG TERM GOAL #2   Title Pt will be able to attain and maintain proper sitting posture for 75% of the time or greater without cueing to decrease pain and demonstrate improved overall function.   Baseline pt has been working toward this and feels succesful.    Time 4   Period Weeks   Status Achieved     PT LONG TERM GOAL #3   Title Pt will have improved overall BUE strength to at least 4+/5 and report no pain to demonstrate improved overall function in order to maximize pt's return to PLOF.   Baseline mild improvment in hands, elbows, wrists; shoulders remain unchanged   Time 4   Period Weeks   Status Not Met               Plan - 01/16/17 1024    Clinical Impression Statement Reassessment performed today. Subjectively, the pt reporting minimal-moderate progress overall. Objectively, mild improvement in ROM of the neck in some but not all areas, and mild improvement in BUE strength distally, but without imporvement proximally. Pt remains grossly weak in BUE. Funcitonal mobility in BUE remain limited by 30-40%. Postural abnormality is moderatelty improved. Pt has no additional vvisists at this time, however, evaluating  therapist will  decide with patient as to whether PT should be extended at this time. ST and LT goal progress has been largely limited. Pt will FU with neurology on Monday.    Rehab Potential Good   PT Frequency 2x / week   PT Duration 4 weeks   PT Treatment/Interventions ADLs/Self Care Home Management;Canalith Repostioning;Moist Heat;Functional mobility training;Therapeutic activities;Therapeutic exercise;Balance training;Neuromuscular re-education;Patient/family education;Manual techniques;Passive range of motion;Dry needling;Energy conservation;Taping;Vestibular   PT Next Visit Plan reassess next visit    PT Home Exercise Plan eval: seated cervical retractions and AAROM into rotation with towel; levator and UT stretches; 5/8: decompression exercises, perform UT and levator stretching with heat pack   Consulted and Agree with Plan of Care Patient      Patient will benefit from skilled therapeutic intervention in order to improve the following deficits and impairments:  Decreased range of motion, Decreased strength, Hypomobility, Impaired flexibility, Increased muscle spasms, Improper body mechanics, Postural dysfunction, Obesity, Pain  Visit Diagnosis: Pain in thoracic spine  Muscle weakness (generalized)  Abnormal posture  Other symptoms and signs involving the musculoskeletal system     Problem List There are no active problems to display for this patient.  10:29 AM, 01/16/17 Etta Grandchild, PT, DPT Physical Therapist at [facility] [phone] (office)     , C 01/16/2017, 10:29 AM   I have read the reassessment performed on Mrs. Clewis and spoke with the PT who performed the reassessment. I am extending pt's cert so that we can have a follow-up visit with the pt after she sees her neurologist on Monday, 01/22/17. At that time, future options going forward for the pt's POC will be discussed.   Geraldine Solar PT, DPT 01/17/17  [facility] [address] [place], [place], [address] Phone: [phone]   Fax:  [phone]  Name: Maria Fisher MRN: [id number] Date of Birth: Feb 27, 1956

## 2017-01-17 NOTE — Addendum Note (Signed)
Addended by: Geralyn Corwin on: 01/17/2017 05:02 PM   Modules accepted: Orders

## 2017-01-22 ENCOUNTER — Ambulatory Visit (INDEPENDENT_AMBULATORY_CARE_PROVIDER_SITE_OTHER): Payer: BLUE CROSS/BLUE SHIELD | Admitting: Neurology

## 2017-01-22 ENCOUNTER — Telehealth: Payer: Self-pay | Admitting: Neurology

## 2017-01-22 ENCOUNTER — Encounter: Payer: Self-pay | Admitting: Neurology

## 2017-01-22 DIAGNOSIS — R29898 Other symptoms and signs involving the musculoskeletal system: Secondary | ICD-10-CM

## 2017-01-22 DIAGNOSIS — M545 Low back pain, unspecified: Secondary | ICD-10-CM | POA: Insufficient documentation

## 2017-01-22 DIAGNOSIS — M542 Cervicalgia: Secondary | ICD-10-CM | POA: Diagnosis not present

## 2017-01-22 DIAGNOSIS — G8929 Other chronic pain: Secondary | ICD-10-CM

## 2017-01-22 MED ORDER — MELOXICAM 15 MG PO TABS: 15.0000 mg | ORAL_TABLET | Freq: Every day | ORAL | 6 refills | Status: DC

## 2017-01-22 MED ORDER — CYCLOBENZAPRINE HCL 5 MG PO TABS: 5.0000 mg | ORAL_TABLET | Freq: Three times a day (TID) | ORAL | 0 refills | Status: DC | PRN
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
## 2017-01-22 NOTE — Telephone Encounter (Signed)
Put spots on hold for NCV and EMG on 6/13 to get pt in sooner.

## 2017-01-23 ENCOUNTER — Ambulatory Visit (HOSPITAL_COMMUNITY): Payer: BLUE CROSS/BLUE SHIELD

## 2017-01-23 ENCOUNTER — Encounter (HOSPITAL_COMMUNITY): Payer: Self-pay

## 2017-01-23 DIAGNOSIS — M546 Pain in thoracic spine: Secondary | ICD-10-CM

## 2017-01-23 DIAGNOSIS — M6281 Muscle weakness (generalized): Secondary | ICD-10-CM

## 2017-01-23 DIAGNOSIS — R29898 Other symptoms and signs involving the musculoskeletal system: Secondary | ICD-10-CM

## 2017-01-23 DIAGNOSIS — R293 Abnormal posture: Secondary | ICD-10-CM

## 2017-01-23 NOTE — Therapy (Signed)
[place], [place], [address] Phone: [phone]   Fax:  [phone]  Physical Therapy Treatment  Patient Details  Name: Maria Fisher MRN: [id number] Date of Birth: 04-18-1956 Referring Provider: Redmond School, MD   Encounter Date: 01/23/2017      PT End of Session - 01/23/17 0826    Visit Number 9   Number of Visits 12   Date for PT Re-Evaluation 02/13/17   Authorization Type BCBS Other   Authorization Time Period 12/19/16 to 01/16/17   PT Start Time 0819   PT Stop Time 0902   PT Time Calculation (min) 43 min   Activity Tolerance Patient tolerated treatment well;No increased pain   Behavior During Therapy WFL for tasks assessed/performed      Past Medical History:  Diagnosis Date  . Back pain   . Diabetes mellitus without complication ([place])   . Hypertension   . Neck pain   . Shoulder pain, bilateral     Past Surgical History:  Procedure Laterality Date  . ABDOMINAL HYSTERECTOMY    . CESAREAN SECTION     x 2  . CHOLECYSTECTOMY      There were no vitals filed for this visit.      Subjective Assessment - 01/23/17 0821    Subjective Pt saw her neurologist last week. She stated that she is going to schedule an MRI and NCV test. She told her that she could continue PT if she wanted to.    Limitations Sitting;House hold activities;Lifting   How long can you sit comfortably? 15-20 minutes (15 mins on 4/17)   How long can you stand comfortably? no issues (no change since eval)    How long can you walk comfortably? 15 minutes (15 mins on 4/17)   Currently in Pain? Yes   Pain Score 4    Pain Location Neck   Pain Orientation Right;Left;Upper   Pain Descriptors / Indicators Aching   Pain Type Acute pain   Pain Onset 1 to 4 weeks ago   Pain Frequency Intermittent   Aggravating Factors  sitting for a long time, moving around a lot, OH arm movement   Pain Relieving Factors pain pills, hot bath   Effect of Pain on Daily Activities increases               [facility] Adult PT Treatment/Exercise - 01/23/17 0001      Neck Exercises: Seated   Other Seated Exercise self median nerve glides x 8 mins total with max cueing for proper technique     Manual Therapy   Manual Therapy Soft tissue mobilization   Manual therapy comments Manual complete separate rest of tx   Soft tissue mobilization bil UT and levator scap  and cervical paraspinals in sitting     Neck Exercises: Stretches   Upper Trapezius Stretch 3 reps;30 seconds  with heat applied   Levator Stretch 3 reps;30 seconds  with heat applied                PT Education - 01/23/17 0904    Education provided Yes   Education Details updated HEP to include self median nerve glides, POC    Person(s) Educated Patient   Methods Explanation;Demonstration;Handout   Comprehension Verbalized understanding;Verbal cues required;Tactile cues required;Need further instruction          PT Short Term Goals - 01/16/17 1013      PT SHORT TERM GOAL #1   Title  Pt will be independent with HEP to promote recovery and decrease risk of reinjury.   Time 2   Period Weeks   Status Achieved     PT SHORT TERM GOAL #2   Title Pt will report 0/10 pain in her back with BUE OH movements to demonstrate improved overall function and maximize participation at home and in the community.   Baseline able to perform all self care with minimal pain, minimal increased time; but not painfree   Time 2   Period Weeks   Status Partially Met     PT SHORT TERM GOAL #3   Title Pt will have improved sitting and walking tolerance by 50% with minimal to no reports of pain in order to maximize her participation at home, church, and in the community.   Time 2   Period Weeks   Status Not Met           PT Long Term Goals - 01/16/17 1015      PT LONG TERM GOAL #1   Title Pt will have improved overall cervical ROM by 15 deg or > with no reports of  pain to demonstrate improved overall function and decreased pain.   Baseline somne improvement, but grossly with minimal change and continued pain    Time 4   Period Weeks   Status Not Met     PT LONG TERM GOAL #2   Title Pt will be able to attain and maintain proper sitting posture for 75% of the time or greater without cueing to decrease pain and demonstrate improved overall function.   Baseline pt has been working toward this and feels succesful.    Time 4   Period Weeks   Status Achieved     PT LONG TERM GOAL #3   Title Pt will have improved overall BUE strength to at least 4+/5 and report no pain to demonstrate improved overall function in order to maximize pt's return to PLOF.   Baseline mild improvment in hands, elbows, wrists; shoulders remain unchanged   Time 4   Period Weeks   Status Not Met               Plan - 01/23/17 0905    Clinical Impression Statement Pt presented to therapy after seeing her neurologist last week. She stated the neurologist told her she could continue with PT if she felt like it was helping and they are going to get an MRI and a NCV test scheduled to r/o carpal tunnel. Pt verbalized that she felt like PT was helping some and wished to continue therapy for 1x/week while she waits to get her testing done. Pt continues with hyperalgesia of cervical musculature and states that she is still having constant pain and numbness in both of her hands. Tinel's, phalen's and reverse phalen's all negative as it did not recreate pt's pain. Median nerve glide testing positive as she reported that it recreated her LUE arm pain. Educated pt on how to peform nerve glides/flossing at home. Continue to perform nerve flossing, as well as manual, postural strengthening, and stretching throughout remainder of POC.   Rehab Potential Good   PT Frequency 1x / week   PT Duration 3 weeks   PT Treatment/Interventions ADLs/Self Care Home Management;Canalith Repostioning;Moist  Heat;Functional mobility training;Therapeutic activities;Therapeutic exercise;Balance training;Neuromuscular re-education;Patient/family education;Manual techniques;Passive range of motion;Dry needling;Energy conservation;Taping;Vestibular   PT Next Visit Plan median, radial, ulnar nerve glides, manual to neck musculature, postural strengthening, continue stretching, add in biceps  long head stretch in doorway and corner stretch   PT Home Exercise Plan eval: seated cervical retractions and AAROM into rotation with towel; levator and UT stretches; 5/8: decompression exercises, perform UT and levator stretching with heat pack; 5/22: self median nerve glides   Consulted and Agree with Plan of Care Patient      Patient will benefit from skilled therapeutic intervention in order to improve the following deficits and impairments:  Decreased range of motion, Decreased strength, Hypomobility, Impaired flexibility, Increased muscle spasms, Improper body mechanics, Postural dysfunction, Obesity, Pain  Visit Diagnosis: Pain in thoracic spine  Muscle weakness (generalized)  Abnormal posture  Other symptoms and signs involving the musculoskeletal system     Problem List Patient Active Problem List   Diagnosis Date Noted  . Neck pain 01/22/2017  . Hand weakness 01/22/2017  . Low back pain 01/22/2017     Geraldine Solar PT, DPT   [facility] [address] [place], [place], [address] Phone: [phone]   Fax:  [phone]  Name: Maria Fisher MRN: [id number] Date of Birth: 1956/03/13

## 2017-01-23 NOTE — Patient Instructions (Signed)
  MEDIAN NERVE GLIDE - C  Start with your arm out to the side with your elbows straight and palm facing upward. Next, bend your wrist up and down.     Your other hand should be checking to make sure that your shoulder stays down and drawn back the entire time.  Perform 2x/day, 2-3 sets of 10 on each arm.

## 2017-01-31 ENCOUNTER — Ambulatory Visit (HOSPITAL_COMMUNITY): Payer: BLUE CROSS/BLUE SHIELD

## 2017-01-31 DIAGNOSIS — M6281 Muscle weakness (generalized): Secondary | ICD-10-CM

## 2017-01-31 DIAGNOSIS — M546 Pain in thoracic spine: Secondary | ICD-10-CM

## 2017-01-31 DIAGNOSIS — R29898 Other symptoms and signs involving the musculoskeletal system: Secondary | ICD-10-CM

## 2017-01-31 DIAGNOSIS — R293 Abnormal posture: Secondary | ICD-10-CM

## 2017-01-31 NOTE — Therapy (Signed)
[place], [place], [address] Phone: [phone]   Fax:  [phone]  Physical Therapy Treatment  Patient Details  Name: Maria Fisher MRN: [id number] Date of Birth: 1956-02-08 Referring Provider: Redmond School, MD   Encounter Date: 01/31/2017      PT End of Session - 01/31/17 1540    Visit Number 10   Number of Visits 12   Date for PT Re-Evaluation 02/13/17   Authorization Type [employer] Other   Authorization Time Period 12/19/16 to 01/16/17   PT Start Time 1034   PT Stop Time 1112   PT Time Calculation (min) 38 min   Activity Tolerance Patient tolerated treatment well;No increased pain   Behavior During Therapy WFL for tasks assessed/performed      Past Medical History:  Diagnosis Date  . Back pain   . Diabetes mellitus without complication ([place])   . Hypertension   . Neck pain   . Shoulder pain, bilateral     Past Surgical History:  Procedure Laterality Date  . ABDOMINAL HYSTERECTOMY    . CESAREAN SECTION     x 2  . CHOLECYSTECTOMY      There were no vitals filed for this visit.      Subjective Assessment - 01/31/17 1039    Subjective Pt stated pain minimal scale 2/10 Lt side of neck.  MRI and NCV test scheduled in June.  Continues to have radicular symptoms around Bil palms   Pertinent History DM, HTN   Patient Stated Goals get back to her PLOF, and decreased pain   Currently in Pain? Yes   Pain Score 2    Pain Location Neck   Pain Orientation Left   Pain Descriptors / Indicators Aching;Tender;Sore   Pain Type Acute pain   Pain Onset 1 to 4 weeks ago   Pain Frequency Intermittent   Aggravating Factors  sitting for a long time, moving around a lot, OH arm movement   Pain Relieving Factors pain pills, hot both, heat   Effect of Pain on Daily Activities increases      Pt prefers treatment in private room vs open       [facility] Adult PT Treatment/Exercise - 01/31/17 0001      Neck  Exercises: Seated   Other Seated Exercise RTB 2x 15 rows      Modalities   Modalities Moist Heat     Moist Heat Therapy   Number Minutes Moist Heat 10 Minutes   Moist Heat Location Other (comment);Cervical  during postural strengthening and stretches     Manual Therapy   Manual Therapy Soft tissue mobilization   Manual therapy comments Manual complete separate rest of tx   Soft tissue mobilization bil UT, levator scap, scalenes and cervical paraspinals in sitting     Neck Exercises: Stretches   Upper Trapezius Stretch 3 reps;30 seconds   Corner Stretch 3 reps;30 seconds   Other Neck Stretches long head of biceps doorway st 2x 30"   Other Neck Stretches Medial nerve glides; ulnar nerve glides standing                  PT Short Term Goals - 01/16/17 1013      PT SHORT TERM GOAL #1   Title Pt will be independent with HEP to promote recovery and decrease risk of reinjury.   Time 2   Period Weeks   Status Achieved     PT SHORT TERM GOAL #2  Title Pt will report 0/10 pain in her back with BUE OH movements to demonstrate improved overall function and maximize participation at home and in the community.   Baseline able to perform all self care with minimal pain, minimal increased time; but not painfree   Time 2   Period Weeks   Status Partially Met     PT SHORT TERM GOAL #3   Title Pt will have improved sitting and walking tolerance by 50% with minimal to no reports of pain in order to maximize her participation at home, church, and in the community.   Time 2   Period Weeks   Status Not Met           PT Long Term Goals - 01/16/17 1015      PT LONG TERM GOAL #1   Title Pt will have improved overall cervical ROM by 15 deg or > with no reports of pain to demonstrate improved overall function and decreased pain.   Baseline somne improvement, but grossly with minimal change and continued pain    Time 4   Period Weeks   Status Not Met     PT LONG TERM GOAL #2    Title Pt will be able to attain and maintain proper sitting posture for 75% of the time or greater without cueing to decrease pain and demonstrate improved overall function.   Baseline pt has been working toward this and feels succesful.    Time 4   Period Weeks   Status Achieved     PT LONG TERM GOAL #3   Title Pt will have improved overall BUE strength to at least 4+/5 and report no pain to demonstrate improved overall function in order to maximize pt's return to PLOF.   Baseline mild improvment in hands, elbows, wrists; shoulders remain unchanged   Time 4   Period Weeks   Status Not Met               Plan - 01/31/17 1115    Clinical Impression Statement Pt arrived reporting pain minimal Lt side of neck and continues to have Bil radicular symptoms on palm of hands.  MHP applied for relaxation of musculature and pain control during seated exercises and stretches.  Medial and ulnar nerve glides instructed with no report of change of symptoms today.  Reviewed complaince and form with medial nerve glides as tested positive last session, moderate therapist facilitation required to improve form and knowledge with purpose of gliding.  EOS with manual to address muscle tightness, improved cervical rotation and reports of pain reduced following.     Rehab Potential Good   PT Frequency 1x / week   PT Duration 3 weeks   PT Treatment/Interventions ADLs/Self Care Home Management;Canalith Repostioning;Moist Heat;Functional mobility training;Therapeutic activities;Therapeutic exercise;Balance training;Neuromuscular re-education;Patient/family education;Manual techniques;Passive range of motion;Dry needling;Energy conservation;Taping;Vestibular   PT Next Visit Plan median, radial, ulnar nerve glides, manual to neck musculature, postural strengthening, continue stretching, add in biceps long head stretch in doorway and corner stretch   PT Home Exercise Plan eval: seated cervical retractions and  AAROM into rotation with towel; levator and UT stretches; 5/8: decompression exercises, perform UT and levator stretching with heat pack; 5/22: self median nerve glides      Patient will benefit from skilled therapeutic intervention in order to improve the following deficits and impairments:  Decreased range of motion, Decreased strength, Hypomobility, Impaired flexibility, Increased muscle spasms, Improper body mechanics, Postural dysfunction, Obesity, Pain  Visit Diagnosis: Pain in  thoracic spine  Muscle weakness (generalized)  Abnormal posture  Other symptoms and signs involving the musculoskeletal system     Problem List Patient Active Problem List   Diagnosis Date Noted  . Neck pain 01/22/2017  . Hand weakness 01/22/2017  . Low back pain 01/22/2017   Ihor Austin, LPTA; [place]  Aldona Lento 01/31/2017, 11:23 AM  [place], [place], [address] Phone: [phone]   Fax:  [phone]  Name: JOYEL CHENETTE MRN: [id number] Date of Birth: February 12, 1956

## 2017-02-06 ENCOUNTER — Encounter (HOSPITAL_COMMUNITY): Payer: Self-pay

## 2017-02-06 ENCOUNTER — Ambulatory Visit (HOSPITAL_COMMUNITY): Payer: BLUE CROSS/BLUE SHIELD | Attending: Internal Medicine

## 2017-02-06 DIAGNOSIS — M6281 Muscle weakness (generalized): Secondary | ICD-10-CM | POA: Diagnosis present

## 2017-02-06 DIAGNOSIS — R293 Abnormal posture: Secondary | ICD-10-CM

## 2017-02-06 DIAGNOSIS — R29898 Other symptoms and signs involving the musculoskeletal system: Secondary | ICD-10-CM | POA: Diagnosis present

## 2017-02-06 DIAGNOSIS — M546 Pain in thoracic spine: Secondary | ICD-10-CM
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
## 2017-02-07 ENCOUNTER — Ambulatory Visit
Admission: RE | Admit: 2017-02-07 | Discharge: 2017-02-07 | Disposition: A | Discharge status: Home / Self Care | Payer: BLUE CROSS/BLUE SHIELD | Source: Ambulatory Visit | Attending: Neurology | Admitting: Neurology

## 2017-02-07 DIAGNOSIS — G8929 Other chronic pain: Secondary | ICD-10-CM

## 2017-02-07 DIAGNOSIS — M545 Low back pain: Secondary | ICD-10-CM

## 2017-02-07 DIAGNOSIS — R29898 Other symptoms and signs involving the musculoskeletal system: Secondary | ICD-10-CM

## 2017-02-07 DIAGNOSIS — M542 Cervicalgia: Secondary | ICD-10-CM | POA: Diagnosis not present

## 2017-02-12 ENCOUNTER — Telehealth: Payer: Self-pay

## 2017-02-12 NOTE — Telephone Encounter (Signed)
LEft vm for patient to call back about mr cervical spine results.

## 2017-02-13 ENCOUNTER — Encounter (HOSPITAL_COMMUNITY): Payer: Self-pay

## 2017-02-13 ENCOUNTER — Ambulatory Visit (HOSPITAL_COMMUNITY): Payer: BLUE CROSS/BLUE SHIELD

## 2017-02-13 DIAGNOSIS — M546 Pain in thoracic spine: Secondary | ICD-10-CM | POA: Diagnosis not present

## 2017-02-13 DIAGNOSIS — R293 Abnormal posture: Secondary | ICD-10-CM

## 2017-02-13 DIAGNOSIS — R29898 Other symptoms and signs involving the musculoskeletal system: Secondary | ICD-10-CM

## 2017-02-13 DIAGNOSIS — M6281 Muscle weakness (generalized): Secondary | ICD-10-CM

## 2017-02-13 NOTE — Telephone Encounter (Signed)
Pt returning call, please call back to discuss results

## 2017-02-13 NOTE — Patient Instructions (Signed)
  Perform these 1x/day, or at least 3-4x/week     [place] - B  Start with your arm hanging down at your side with your elbows straight and palm facing forward. Next, bend your wrist back as you side bend your head towards the target arm as shown. Next, bend your wrist forward as you side bend your head away from the target arm.   Your other hand should be checking to make sure that your shoulder stays down and drawn back the entire time.   Perform 2-3 sets of 10 reps on each side, but mainly the Left   UPPER TRAP STRETCH - HOLDING CHAIR  While sitting in a chair, hold the seat with one hand and bend your head towards the opposite side for a gentle stretch to the side of the neck.  Perform 3-5 stretches, holding for 30 seconds each   Left Levator Scapulae Stretch  Begin by retracting your head back into a gentle chin tuck position with shoulder blades back. Next, place left hand under your left leg/upper thigh with your palm facing up and gently lean your nose toward your right armpit by looking to the right and then nodding down.  You should be looking towards your opposite pocket of the right side.  Gentle is important, do not go into pain.     Can either sit on hand or hold edge of chair  Perform 3-5 stretches, holding for 30 seconds each   Cervical Retraction aka Chin Tuck  While maintaining good posture, sit neutrally and place your pointer finger against your chin while looking straight ahead. Without moving the finger, draw your head directly backwards, keeping your gaze parallel to the floor. Return to neutral, bringing your chin back against your pointer finger. Make sure you are not going past the neutral neck position.  Perform 2-3 sets of 10, holding for 2-3 seconds each
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
## 2017-02-14 ENCOUNTER — Ambulatory Visit (INDEPENDENT_AMBULATORY_CARE_PROVIDER_SITE_OTHER): Payer: BLUE CROSS/BLUE SHIELD | Admitting: Neurology

## 2017-02-14 DIAGNOSIS — M542 Cervicalgia: Secondary | ICD-10-CM

## 2017-02-14 DIAGNOSIS — G8929 Other chronic pain: Secondary | ICD-10-CM

## 2017-02-14 DIAGNOSIS — G5603 Carpal tunnel syndrome, bilateral upper limbs: Secondary | ICD-10-CM | POA: Diagnosis not present

## 2017-02-14 DIAGNOSIS — G5600 Carpal tunnel syndrome, unspecified upper limb: Secondary | ICD-10-CM | POA: Insufficient documentation

## 2017-02-14 DIAGNOSIS — R202 Paresthesia of skin: Secondary | ICD-10-CM | POA: Diagnosis not present

## 2017-02-14 DIAGNOSIS — M545 Low back pain: Secondary | ICD-10-CM

## 2017-02-14 DIAGNOSIS — R29898 Other symptoms and signs involving the musculoskeletal system: Secondary | ICD-10-CM

## 2017-02-14 MED ORDER — DICLOFENAC SODIUM 3 % TD GEL: 4.0000 g | TRANSDERMAL | 6 refills | Status: DC | PRN

## 2017-02-14 MED ORDER — CYCLOBENZAPRINE HCL 5 MG PO TABS: 5.0000 mg | ORAL_TABLET | Freq: Three times a day (TID) | ORAL | 6 refills | Status: DC | PRN

## 2017-02-14 NOTE — Telephone Encounter (Signed)
LEft vm for patient to call back during business hrs for results.
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
## 2017-02-19 NOTE — Procedures (Signed)
Full Name: Maria Fisher Gender: Female MRN #: [id number] Date of Birth: [birth year]    Visit Date: 02/14/2017 09:13 Age: 61 Years 72 Months Old Examining Physician: Marcial Pacas, MD  Referring Physician: Krista Blue, MD History: 61 years old female, presented with bilateral hands paresthesia.  Summary of the test: Nerve conduction study: Bilateral ulnar sensory and motor responses were normal. Bilateral median sensory responses showed prolonged peak latency, right worse than left, with mildly decreased snap amplitude. Right median motor responses showed moderately prolonged distal latency, with normal C map amplitude. Left median motor responses were within normal limits.  Electromyography: Selective needle examinations were performed at right upper extremity muscles and right cervical paraspinal muscles. There was no active denervation at right abductor pollicis brevis, there was normal motor unit potential with mild decreased recruitment, suggestive of chronic demyelinating neuropathic changes. Rest of the needle examination showed no significant abnormality.  Conclusion:  This is an abnormal study. There is electrodiagnostic evidence of bilateral median neuropathy across the wrist, consistent with moderate bilateral carpal tunnel syndromes, right worse than left, demyelinating in nature. There is no evidence of axonal loss.   ------------------------------- Marcial Pacas, M.D.  [facility] [place], [place] [address] Tel: [phone] Fax: [phone]        [facility]    Nerve / Sites Rec. Site Latency Ref. Amplitude Ref. Rel Amp Segments Distance Velocity Ref. Area    ms ms mV mV %  cm m/s m/s mVms  L Median - APB     Wrist APB 4.2 ?4.4 10.6 ?4.0 100 Wrist - APB 7   37.5     Upper arm APB 7.7  10.3  97.5 Upper arm - Wrist 19 55 ?49 36.8  R Median - APB     Wrist APB 5.5 ?4.4 7.0 ?4.0 100 Wrist - APB 7   26.3     Upper arm APB 9.5  6.6  93.7 Upper arm  - Wrist 20 50 ?49 24.3  L Ulnar - ADM     Wrist ADM 2.9 ?3.3 8.7 ?6.0 100 Wrist - ADM 7   26.9     B.Elbow ADM 6.2  8.2  94.5 B.Elbow - Wrist 17 51 ?49 27.3     A.Elbow ADM 8.1  7.6  92.7 A.Elbow - B.Elbow 10 52 ?49 27.6         A.Elbow - Wrist      R Ulnar - ADM     Wrist ADM 2.4 ?3.3 10.8 ?6.0 100 Wrist - ADM 7   30.2     B.Elbow ADM 5.5  10.4  96.5 B.Elbow - Wrist 18 59 ?49 30.2     A.Elbow ADM 7.2  9.7  93.2 A.Elbow - B.Elbow 10 58 ?49 29.1         A.Elbow - Wrist                 SNC    Nerve / Sites Rec. Site Peak Lat Ref.  Amp Ref. Segments Distance    ms ms V V  cm  L Median - Orthodromic (Dig II, Mid palm)     Dig II Wrist 3.75 ?3.40 13 ?10 Dig II - Wrist 13  R Median - Orthodromic (Dig II, Mid palm)     Dig II Wrist 4.69 ?3.40 7 ?10 Dig II - Wrist 13  L Ulnar - Orthodromic, (Dig V, Mid palm)     Dig V Wrist 2.86 ?3.10  5 ?5 Dig V - Wrist 11  R Ulnar - Orthodromic, (Dig V, Mid palm)     Dig V Wrist 2.50 ?3.10 6 ?5 Dig V - Wrist 44             F  Wave    Nerve F Lat Ref.   ms ms  L Ulnar - ADM 26.2 ?32.0  R Ulnar - ADM 25.2 ?32.0         EMG full       EMG Summary Table    Spontaneous MUAP Recruitment  Muscle IA Fib PSW Fasc Other Amp Dur. Poly Pattern  R. First dorsal interosseous Normal None None None _______ Normal Normal Normal Normal  R. Abductor pollicis brevis Normal None None None _______ Normal Normal Normal Reduced  R. Pronator teres Normal None None None _______ Normal Normal Normal Normal  R. Brachioradialis Normal None None None _______ Normal Normal Normal Normal  R. Deltoid Normal None None None _______ Normal Normal Normal Normal  R. Biceps brachii Normal None None None _______ Normal Normal Normal Normal  R. Triceps brachii Normal None None None _______ Normal Normal Normal Normal  R. Cervical paraspinals Normal None None None _______ Normal Normal Normal Normal

## 2017-02-19 NOTE — Telephone Encounter (Signed)
Rn call patient that the MRI cervical spine shows evidence of mild degenerative changes no significant canal or foraminal stenosis. Pt verbalized understanding.

## 2017-02-20 ENCOUNTER — Encounter (HOSPITAL_COMMUNITY): Payer: BLUE CROSS/BLUE SHIELD

## 2017-02-27 ENCOUNTER — Encounter (HOSPITAL_COMMUNITY): Payer: BLUE CROSS/BLUE SHIELD

## 2017-03-23 ENCOUNTER — Encounter: Payer: BLUE CROSS/BLUE SHIELD | Admitting: Neurology

## 2017-06-12 ENCOUNTER — Ambulatory Visit (INDEPENDENT_AMBULATORY_CARE_PROVIDER_SITE_OTHER): Payer: BLUE CROSS/BLUE SHIELD | Admitting: Family Medicine

## 2017-06-12 ENCOUNTER — Encounter: Payer: Self-pay | Admitting: Family Medicine

## 2017-06-12 VITALS — BP 134/82 | HR 68 | Temp 98.0°F | Resp 16 | Ht 61.0 in | Wt 216.0 lb

## 2017-06-12 DIAGNOSIS — Z79899 Other long term (current) drug therapy: Secondary | ICD-10-CM

## 2017-06-12 DIAGNOSIS — Z23 Encounter for immunization: Secondary | ICD-10-CM | POA: Diagnosis not present

## 2017-06-12 DIAGNOSIS — E1165 Type 2 diabetes mellitus with hyperglycemia: Secondary | ICD-10-CM | POA: Insufficient documentation

## 2017-06-12 DIAGNOSIS — R5383 Other fatigue: Secondary | ICD-10-CM | POA: Diagnosis not present

## 2017-06-12 DIAGNOSIS — I10 Essential (primary) hypertension: Secondary | ICD-10-CM | POA: Insufficient documentation

## 2017-06-12 MED ORDER — ATORVASTATIN CALCIUM 40 MG PO TABS: 40.0000 mg | ORAL_TABLET | Freq: Every day | ORAL | 3 refills | Status: DC

## 2017-06-12 NOTE — Progress Notes (Signed)
Patient ID: Maria Fisher, female    DOB: March 30, 1956, 61 y.o.   MRN: [id number]  Chief Complaint  Patient presents with  . Diabetes    Allergies Patient has no known allergies.  Subjective:   Maria Fisher is a 61 y.o. female who presents to [facility] today.  HPI Here as a new patient visit. Has been diabetic for 10 years. Patient reports that her sugars have not always been controlled but she has never been on insulin. Reports that her metformin is prescribed for twice a day dosing but she has only been taking metformin once a day. Eats out a lot. Eats country ham and grits most days for breakfast. Reports that she has been slowly loosing weight. Has lost about five pounds. Walks most days for exercise. Does not check sugars at home and does not have a glucometer. Denies hypoglycemic episodes. Sees the eye doctor every year for diabetic eye exam. Denies any lesions on her feet. Denies chest pain shortness of breath or swelling in her legs. Denies any history of heart attack or stroke. Denies any side effects with her medications, but does not like to swallow big pills.  Has a history of high blood pressure and takes her medications as directed. Reports she has never been put on cholesterol medication but believes her cholesterol has been elevated in the past. Patient reports that she takes her aspirin every day. Reports she would be willing to take cholesterol medication if it was needed. Reports that her mood is good energy level can be low at times. Does not smoke or drink alcohol or do drugs.    Past Medical History:  Diagnosis Date  . Back pain   . Diabetes mellitus without complication ([place])   . Heart murmur   . Hyperlipidemia   . Hypertension   . Neck pain   . Neuromuscular disorder ([place])   . Shoulder pain, bilateral     Past Surgical History:  Procedure Laterality Date  . ABDOMINAL HYSTERECTOMY    . CESAREAN SECTION     x 2  . CHOLECYSTECTOMY       Family History  Problem Relation Age of Onset  . Diabetes Mother   . Heart disease Mother   . Kidney disease Mother   . Depression Mother   . Hypertension Mother   . Hyperlipidemia Mother   . Cirrhosis Father   . Alcohol abuse Father   . Diabetes Sister   . Hypertension Sister   . Hypertension Brother   . Hypertension Daughter   . Hypertension Son      Social History   Social History  . Marital status: Married    Spouse name: N/A  . Number of children: 2  . 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 Smoking status: Never Smoker  . Smokeless tobacco: Never Used  . Alcohol use No  . Drug use: No  . Sexual activity: Not Currently    Partners: Male   Other Topics Concern  . None   Social History Narrative   Lives at home with her husband. Married.    Right-handed.   3 cups coffee most days. Eats all food groups.    Does walking for exercise.    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    Enjoys gong to [employer] and [employer].   Has two children.  Review of Systems  Constitutional: Positive for fatigue. Negative for appetite change, fever and unexpected weight change.  Respiratory: Negative for cough, chest tightness and shortness of breath.   Cardiovascular: Negative for chest pain, palpitations and leg swelling.  Gastrointestinal: Negative for blood in stool and constipation.  Genitourinary: Negative for dysuria, frequency and urgency.  Musculoskeletal: Negative for myalgias.  Skin: Negative for rash.  Neurological: Negative for facial asymmetry, light-headedness and headaches.  Hematological: Negative for adenopathy. Does not bruise/bleed easily.  Psychiatric/Behavioral: Negative for dysphoric mood. The patient is not nervous/anxious.    Current Outpatient Prescriptions on File Prior to Visit  Medication Sig Dispense Refill  . aspirin 81 MG tablet Take 81 mg by mouth daily.    Marland Kitchen  lisinopril-hydrochlorothiazide (PRINZIDE,ZESTORETIC) 20-25 MG tablet Take 1 tablet by mouth daily.    . metFORMIN (GLUCOPHAGE) 1000 MG tablet Take 1 tablet by mouth 2 (two) times daily.     No current facility-administered medications on file prior to visit.      Objective:   BP 134/82 (BP Location: Right Arm, Patient Position: Sitting, Cuff Size: Normal)   Pulse 68   Temp 98 F (36.7 C) (Temporal)   Resp 16   Ht 5\' 1"  (1.549 m)   Wt 216 lb 0.6 oz (98 kg)   SpO2 98%   BMI 40.82 kg/m   Physical Exam Vital signs reviewed. Heart regular rate and rhythm. Lungs clear to auscultation bilaterally. Patient defers diabetic foot exam today. Cranial nerves II through XII grossly intact. Skin intact without rashes. 2+ pulses in lower extremities. Moist mucous membranes pupils equally round reactive to light. Abdomen obese, soft nondistended nontender positive bowel sounds. Mood euthymic. Affect congruent with mood pleasant well-dressed female.  Assessment and Plan  1. Type 2 diabetes mellitus with hyperglycemia, without long-term current use of insulin ([place]) Diabetic eye exam referral placed. Patient counseled in detail regarding the risks of medication. Told to call or return to clinic if develop any worrisome signs or symptoms. Patient voiced understanding.  Diet exercise and weight loss encouraged. - COMPLETE METABOLIC PANEL WITH GFR - Hemoglobin A1c - Lipid panel - Microalbumin / creatinine urine ratio - atorvastatin (LIPITOR) 40 MG tablet; Take 1 tablet (40 mg total) by mouth daily.  Dispense: 90 tablet; Refill: 3 Calculated patient's ASCVD risk score today and 10 year risk greater than 20%. At this time will start statin therapy and plan to recheck in 2 months.Hyperlipidemia and the associated risk of ASCVD were discussed today. Primary vs. Secondary prevention of ASCVD were discussed and how it relates to patient morbidity, mortality, and quality of life. Shared decision making with  patient including the risks of statins vs.benefits of ASCVD risk reduction discussed.  Risks of stains discussed including myopathy, rhabdomyoloysis, liver problems, increased risk of diabetes discussed. We discussed heart healthy diet, lifestyle modifications, risk factor modifications, and adherence to the recommended treatment plan. We discussed the need to periodically monitor lipid panel and liver function tests while on statin therapy.  Continue aspirin to decrease cardiac risk. 2. HTN, goal below 130/80 Check labs as ordered. Lifestyle modifications discussed with patient including a diet emphasizing vegetables, fruits, and whole grains. Limiting intake of sodium to less than 2,400 mg per day.  Recommendations discussed include consuming low-fat dairy products, poultry, fish, legumes, non-tropical vegetable oils, and nuts; and limiting intake of sweets, sugar-sweetened beverages, and red meat. Discussed following a plan such as the Dietary Approaches to Stop Hypertension (DASH) diet. Patient to read up on  this diet.   - Urinalysis, Routine w reflex microscopic  3. Fatigue, unspecified type  - CBC - VITAMIN D 25 Hydroxy (Vit-D Deficiency, Fractures) - TSH  4. High risk medication use Check labs.  5. Immunization due Defers pneumonia shot today but reports will get at subsequent visit. - Flu Vaccine QUAD 36+ mos IM  Patient is to return for follow-up fasting to recheck cholesterol at that time. Return in about 3 months (around 09/12/2017). Caren Macadam, MD 06/12/2017

## 2017-06-12 NOTE — Patient Instructions (Signed)
Diabetes Mellitus and Food It is important for you to manage your blood sugar (glucose) level. Your blood glucose level can be greatly affected by what you eat. Eating healthier foods in the appropriate amounts throughout the day at about the same time each day will help you control your blood glucose level. It can also help slow or prevent worsening of your diabetes mellitus. Healthy eating may even help you improve the level of your blood pressure and reach or maintain a healthy weight. General recommendations for healthful eating and cooking habits include:  Eating meals and snacks regularly. Avoid going long periods of time without eating to lose weight.  Eating a diet that consists mainly of plant-based foods, such as fruits, vegetables, nuts, legumes, and whole grains.  Using low-heat cooking methods, such as baking, instead of high-heat cooking methods, such as deep frying.  Work with your dietitian to make sure you understand how to use the Nutrition Facts information on food labels. How can food affect me? Carbohydrates Carbohydrates affect your blood glucose level more than any other type of food. Your dietitian will help you determine how many carbohydrates to eat at each meal and teach you how to count carbohydrates. Counting carbohydrates is important to keep your blood glucose at a healthy level, especially if you are using insulin or taking certain medicines for diabetes mellitus. Alcohol Alcohol can cause sudden decreases in blood glucose (hypoglycemia), especially if you use insulin or take certain medicines for diabetes mellitus. Hypoglycemia can be a life-threatening condition. Symptoms of hypoglycemia (sleepiness, dizziness, and disorientation) are similar to symptoms of having too much alcohol. If your health care provider has given you approval to drink alcohol, do so in moderation and use the following guidelines:  Women should not have more than one drink per day, and men  should not have more than two drinks per day. One drink is equal to: ? 12 oz of beer. ? 5 oz of wine. ? 1 oz of hard liquor.  Do not drink on an empty stomach.  Keep yourself hydrated. Have water, diet soda, or unsweetened iced tea.  Regular soda, juice, and other mixers might contain a lot of carbohydrates and should be counted.  What foods are not recommended? As you make food choices, it is important to remember that all foods are not the same. Some foods have fewer nutrients per serving than other foods, even though they might have the same number of calories or carbohydrates. It is difficult to get your body what it needs when you eat foods with fewer nutrients. Examples of foods that you should avoid that are high in calories and carbohydrates but low in nutrients include:  Trans fats (most processed foods list trans fats on the Nutrition Facts label).  Regular soda.  Juice.  Candy.  Sweets, such as cake, pie, doughnuts, and cookies.  Fried foods.  What foods can I eat? Eat nutrient-rich foods, which will nourish your body and keep you healthy. The food you should eat also will depend on several factors, including:  The calories you need.  The medicines you take.  Your weight.  Your blood glucose level.  Your blood pressure level.  Your cholesterol level.  You should eat a variety of foods, including:  Protein. ? Lean cuts of meat. ? Proteins low in saturated fats, such as fish, egg whites, and beans. Avoid processed meats.  Fruits and vegetables. ? Fruits and vegetables that may help control blood glucose levels, such as apples,   mangoes, and yams.  Dairy products. ? Choose fat-free or low-fat dairy products, such as milk, yogurt, and cheese.  Grains, bread, pasta, and [place]. ? Choose whole grain products, such as multigrain bread, whole oats, and brown [place]. These foods may help control blood pressure.  Fats. ? Foods containing healthful fats, such as  nuts, avocado, olive oil, canola oil, and fish.  Does everyone with diabetes mellitus have the same meal plan? Because every person with diabetes mellitus is different, there is not one meal plan that works for everyone. It is very important that you meet with a dietitian who will help you create a meal plan that is just right for you. This information is not intended to replace advice given to you by your health care provider. Make sure you discuss any questions you have with your health care provider. Document Released: 05/18/2005 Document Revised: 01/27/2016 Document Reviewed: 07/18/2013 Elsevier Interactive Patient Education  2017 Elsevier Inc.  

## 2017-06-26 ENCOUNTER — Telehealth: Payer: Self-pay | Admitting: Family Medicine

## 2017-06-26 DIAGNOSIS — E0590 Thyrotoxicosis, unspecified without thyrotoxic crisis or storm: Secondary | ICD-10-CM

## 2017-06-26 NOTE — Telephone Encounter (Signed)
Please call patient and advise that her thyroid is abnormal and she needs to return to the lab for additional testing. Advise to schedule follow up to discuss. Gwen Her. Mannie Stabile, MD

## 2017-06-27 LAB — HEMOGLOBIN A1C
EAG (MMOL/L): 9 (calc)
HEMOGLOBIN A1C: 7.3 %{Hb} — AB (ref <5.7)
MEAN PLASMA GLUCOSE: 163 (calc)

## 2017-06-27 LAB — COMPLETE METABOLIC PANEL WITH GFR
AG Ratio: 1.1 (calc) (ref 1.0–2.5)
ALT: 12 U/L (ref 6–29)
AST: 12 U/L (ref 10–35)
Albumin: 4 g/dL (ref 3.6–5.1)
Alkaline phosphatase (APISO): 75 U/L (ref 33–130)
BUN: 15 mg/dL (ref 7–25)
CALCIUM: 9.8 mg/dL (ref 8.6–10.4)
CO2: 30 mmol/L (ref 20–32)
CREATININE: 0.95 mg/dL (ref 0.50–0.99)
Chloride: 99 mmol/L (ref 98–110)
GFR, EST NON AFRICAN AMERICAN: 64 mL/min/{1.73_m2} (ref >=60)
GFR, Est African American: 74 mL/min/{1.73_m2} (ref >=60)
GLUCOSE: 183 mg/dL — AB (ref 65–139)
Globulin: 3.5 g/dL (calc) (ref 1.9–3.7)
Potassium: 4.2 mmol/L (ref 3.5–5.3)
Sodium: 137 mmol/L (ref 135–146)
TOTAL PROTEIN: 7.5 g/dL (ref 6.1–8.1)
Total Bilirubin: 0.4 mg/dL (ref 0.2–1.2)

## 2017-06-27 LAB — URINALYSIS, ROUTINE W REFLEX MICROSCOPIC
BILIRUBIN URINE: NEGATIVE
GLUCOSE, UA: NEGATIVE
Hgb urine dipstick: NEGATIVE
Ketones, ur: NEGATIVE
Leukocytes, UA: NEGATIVE
Nitrite: NEGATIVE
Protein, ur: NEGATIVE
SPECIFIC GRAVITY, URINE: 1.012 (ref 1.001–1.03)
pH: 5.5 (ref 5.0–8.0)

## 2017-06-27 LAB — CBC
HCT: 37.2 % (ref 35.0–45.0)
Hemoglobin: 12.1 g/dL (ref 11.7–15.5)
MCH: 26.9 pg — AB (ref 27.0–33.0)
MCHC: 32.5 g/dL (ref 32.0–36.0)
MCV: 82.7 fL (ref 80.0–100.0)
MPV: 10.6 fL (ref 7.5–12.5)
PLATELETS: 336 10*3/uL (ref 140–400)
RBC: 4.5 10*6/uL (ref 3.80–5.10)
RDW: 12.9 % (ref 11.0–15.0)
WBC: 7.6 10*3/uL (ref 3.8–10.8)

## 2017-06-27 LAB — LIPID PANEL
CHOL/HDL RATIO: 2.1 (calc) (ref <5.0)
Cholesterol: 144 mg/dL (ref <200)
HDL: 69 mg/dL (ref >50)
LDL Cholesterol (Calc): 56 mg/dL (calc)
NON-HDL CHOLESTEROL (CALC): 75 mg/dL (ref <130)
TRIGLYCERIDES: 108 mg/dL (ref <150)

## 2017-06-27 LAB — VITAMIN D 25 HYDROXY (VIT D DEFICIENCY, FRACTURES): Vit D, 25-Hydroxy: 11 ng/mL — ABNORMAL LOW (ref 30–100)

## 2017-06-27 LAB — MICROALBUMIN / CREATININE URINE RATIO
Creatinine, Urine: 60 mg/dL (ref 20–275)
Microalb Creat Ratio: 10 mcg/mg creat (ref <30)
Microalb, Ur: 0.6 mg/dL

## 2017-06-27 LAB — TSH: TSH: 0.14 m[IU]/L — AB (ref 0.40–4.50)

## 2017-06-27 NOTE — Telephone Encounter (Signed)
Left message

## 2017-07-03 ENCOUNTER — Other Ambulatory Visit: Payer: Self-pay | Admitting: Family Medicine

## 2017-07-03 ENCOUNTER — Encounter: Payer: Self-pay | Admitting: Family Medicine

## 2017-07-03 ENCOUNTER — Ambulatory Visit (INDEPENDENT_AMBULATORY_CARE_PROVIDER_SITE_OTHER): Payer: BLUE CROSS/BLUE SHIELD | Admitting: Family Medicine

## 2017-07-03 VITALS — BP 118/86 | HR 80 | Wt 219.8 lb

## 2017-07-03 DIAGNOSIS — E559 Vitamin D deficiency, unspecified: Secondary | ICD-10-CM | POA: Diagnosis not present

## 2017-07-03 DIAGNOSIS — Z1231 Encounter for screening mammogram for malignant neoplasm of breast: Secondary | ICD-10-CM

## 2017-07-03 DIAGNOSIS — I10 Essential (primary) hypertension: Secondary | ICD-10-CM

## 2017-07-03 DIAGNOSIS — E1165 Type 2 diabetes mellitus with hyperglycemia: Secondary | ICD-10-CM

## 2017-07-03 DIAGNOSIS — E785 Hyperlipidemia, unspecified: Secondary | ICD-10-CM | POA: Insufficient documentation

## 2017-07-03 DIAGNOSIS — Z1239 Encounter for other screening for malignant neoplasm of breast: Secondary | ICD-10-CM

## 2017-07-03 DIAGNOSIS — E782 Mixed hyperlipidemia: Secondary | ICD-10-CM

## 2017-07-03 DIAGNOSIS — E049 Nontoxic goiter, unspecified: Secondary | ICD-10-CM

## 2017-07-03 LAB — T4, FREE: FREE T4: 1.1 ng/dL (ref 0.8–1.8)

## 2017-07-03 LAB — THYROID STIMULATING IMMUNOGLOBULIN

## 2017-07-03 LAB — TSH: TSH: 0.1 m[IU]/L — AB (ref 0.40–4.50)

## 2017-07-03 LAB — T3: T3, Total: 98 ng/dL (ref 76–181)

## 2017-07-03 MED ORDER — VITAMIN D 50 MCG (2000 UT) PO CAPS: ORAL_CAPSULE | ORAL | Status: DC

## 2017-07-03 MED ORDER — CALCIUM 500 MG PO TABS: 600.0000 mg | ORAL_TABLET | Freq: Two times a day (BID) | ORAL | Status: DC

## 2017-07-03 NOTE — Progress Notes (Signed)
Patient ID: Maria Fisher, female    DOB: 04/20/1956, 61 y.o.   MRN: [id number]  Chief Complaint  Patient presents with  . Thyroid Problem    4 week follow up    Allergies Patient has no known allergies.  Subjective:   Maria Fisher is a 61 y.o. female who presents to [facility] today.  HPI Here to discuss labs and thyroid. Has been feeling good. No complaints. Has been taking medications for diabetes since we last discussed. Denies any hypoglycemic episodes. Reports that she feels good. Denies any chest pain, shortness of breath, edema, or palpitations. Feels like her blood pressure has been running well. Takes her cholesterol medications daily denies any myalgias. Would like to review and discuss her blood work. Reports she has never really gone over her labs with anyone. Reports she is not sure where her cholesterol level should be. Patient wondering now that her cholesterol is good could she stopped the statin.     Thyroid Problem  Presents for initial visit. Patient reports no anxiety, cold intolerance, constipation, depressed mood, diaphoresis, diarrhea, dry skin, hair loss, palpitations, tremors or weight gain. Past treatments include nothing. The following procedures have not been performed: thyroid FNA and thyroid ultrasound. Her past medical history is significant for diabetes and hyperlipidemia. There are no known risk factors.    Past Medical History:  Diagnosis Date  . Back pain   . Diabetes mellitus without complication ([place])   . Heart murmur   . Hyperlipidemia   . Hypertension   . Neck pain   . Neuromuscular disorder ([place])   . Shoulder pain, bilateral     Past Surgical History:  Procedure Laterality Date  . ABDOMINAL HYSTERECTOMY    . CESAREAN SECTION     x 2  . CHOLECYSTECTOMY      Family History  Problem Relation Age of Onset  . Diabetes Mother   . Heart disease Mother   . Kidney disease Mother   . Depression Mother   .  Hypertension Mother   . Hyperlipidemia Mother   . Cirrhosis Father   . Alcohol abuse Father   . Diabetes Sister   . Hypertension Sister   . Hypertension Brother   . Hypertension Daughter   . Hypertension Son      Social History   Social History  . Marital status: Married    Spouse name: N/A  . Number of children: 2  . 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 Smoking status: Never Smoker  . Smokeless tobacco: Never Used  . Alcohol use No  . Drug use: No  . Sexual activity: Not Currently    Partners: Male   Other Topics Concern  . None   Social History Narrative   Lives at home with her husband. Married.    Right-handed.   3 cups coffee most days. Eats all food groups.    Does walking for exercise.    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    Enjoys gong to [employer] and [employer].   Has two children.    Current Outpatient Prescriptions on File Prior to Visit  Medication Sig Dispense Refill  . aspirin 81 MG tablet Take 81 mg by mouth daily.    Marland Kitchen atorvastatin (LIPITOR) 40 MG tablet Take 1 tablet (40 mg total) by mouth daily. 90 tablet 3  . lisinopril-hydrochlorothiazide (PRINZIDE,ZESTORETIC) 20-25  MG tablet Take 1 tablet by mouth daily.    . metFORMIN (GLUCOPHAGE) 1000 MG tablet Take 1 tablet by mouth 2 (two) times daily.     No current facility-administered medications on file prior to visit.     Review of Systems  Constitutional: Negative for activity change, appetite change, diaphoresis, fever and weight gain.  HENT: Negative for trouble swallowing and voice change.   Eyes: Negative for visual disturbance.  Respiratory: Negative for cough, chest tightness and shortness of breath.   Cardiovascular: Negative for chest pain, palpitations and leg swelling.  Gastrointestinal: Negative for abdominal pain, constipation, diarrhea, nausea and vomiting.  Endocrine: Negative for cold intolerance,  polydipsia, polyphagia and polyuria.  Genitourinary: Negative for dysuria, frequency and urgency.  Musculoskeletal: Negative for arthralgias.  Skin: Negative for rash.  Neurological: Negative for dizziness, tremors, syncope, weakness and light-headedness.  Hematological: Negative for adenopathy.  Psychiatric/Behavioral: Negative for dysphoric mood. The patient is not nervous/anxious.      Objective:   BP 118/86   Pulse 80   Wt 219 lb 12 oz (99.7 kg)   SpO2 98%   BMI 41.52 kg/m   Physical Exam  Constitutional: She is oriented to person, place, and time. She appears well-developed and well-nourished.  HENT:  Head: Normocephalic and atraumatic.  Eyes: Pupils are equal, round, and reactive to light. Conjunctivae and EOM are normal.  Neck: Trachea normal, normal range of motion and phonation normal. Neck supple. No JVD present. No tracheal tenderness present. No neck rigidity. No tracheal deviation and no erythema present. Thyromegaly present.  Right thyroid lobe palpable and larger than left side.  Cardiovascular: Normal rate, regular rhythm, normal heart sounds and intact distal pulses.   Pulmonary/Chest: Effort normal and breath sounds normal. No stridor.  Abdominal: Soft. Bowel sounds are normal.  Neurological: She is alert and oriented to person, place, and time. No cranial nerve deficit.  Skin: Skin is warm and dry. No rash noted.  Psychiatric: She has a normal mood and affect. Her behavior is normal. Judgment and thought content normal.  Vitals reviewed.    Assessment and Plan   1. Vitamin D deficiency Start supplementation and plan to recheck levels in 3 months. Patient counseled regarding vitamins and minerals and administration. - Cholecalciferol (VITAMIN D) 2000 units CAPS; Take one a day  Dispense: 30 capsule - Calcium 500 MG tablet; Take 1 tablet (500 mg total) by mouth 2 (two) times daily.  Dispense: 30 tablet  2. Screening for breast cancer Monthly self breast  exam and yearly clinical breast exam recommended. She will follow-up for complete physical examination. - MM Digital Screening; Future   3. Thyroid enlargement Right thyroid lobe felt to be larger than left on examination and in setting of subclinical hyperthyroidism, plan to check ultrasound to rule out any thyroid issue. Discussed all labs with patient and will plan on rechecking labs in 6 months or sooner if she becomes symptomatic. Labs explained and all questions addressed. - US THYROID; Future  4. HTN, goal below 130/80 Diet exercise and weight loss recommended. Continue to monitor salt intake. Continue medications as directed. Labs discussed with patient and reviewed today. Kidney function improved since last check 7 months ago.  5. Type 2 diabetes mellitus with hyperglycemia, without long-term current use of insulin ([facility]) Hemoglobin A1c at 7.3%. Patient reports she has not been taking her medications as directed. She will work on diet and exercise. In addition compliance with medication discussed. Referral placed for diabetic eye exam last  time but reports she did not schedule the appointment. Care discussed today.  6. Mixed hyperlipidemia LDL, HDL, and total cholesterol at goal. Continue statin and periodically monitor liver functions.Patient counseled in detail regarding the risks of medication. Told to call or return to clinic if develop any worrisome signs or symptoms. Patient voiced understanding.  Discussed this in detail patient will continue her medications at this time and understands the reason why she needs to.  Patient defers pneumonia vaccines at this time. Return in about 3 months (around 10/03/2017) for diabetes. Caren Macadam, MD 07/03/2017

## 2017-07-03 NOTE — Progress Notes (Signed)
    Patient ID: Maria Fisher, female    DOB: 12-Jun-1956, 61 y.o.   MRN: [id number]  Chief Complaint  Patient presents with  . Thyroid Problem    4 week follow up    Allergies Patient has no known allergies.  Subjective:   Maria Fisher is a 61 y.o. female who presents to [facility] today.  HPI HPI  Past Medical History:  Diagnosis Date  . Back pain   . Diabetes mellitus without complication ([place])   . Heart murmur   . Hyperlipidemia   . Hypertension   . Neck pain   . Neuromuscular disorder ([place])   . Shoulder pain, bilateral     Past Surgical History:  Procedure Laterality Date  . ABDOMINAL HYSTERECTOMY    . CESAREAN SECTION     x 2  . CHOLECYSTECTOMY      Family History  Problem Relation Age of Onset  . Diabetes Mother   . Heart disease Mother   . Kidney disease Mother   . Depression Mother   . Hypertension Mother   . Hyperlipidemia Mother   . Cirrhosis Father   . Alcohol abuse Father   . Diabetes Sister   . Hypertension Sister   . Hypertension Brother   . Hypertension Daughter   . Hypertension Son      Social History   Social History  . Marital status: Married    Spouse name: N/A  . Number of children: 2  . 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 Smoking status: Never Smoker  . Smokeless tobacco: Never Used  . Alcohol use No  . Drug use: No  . Sexual activity: Not Currently    Partners: Male   Other Topics Concern  . None   Social History Narrative   Lives at home with her husband. Married.    Right-handed.   3 cups coffee most days. Eats all food groups.    Does walking for exercise.    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    Enjoys gong to [employer] and [employer].   Has two children.     Review of Systems   Objective:   BP 118/86   Pulse 80   Wt 219 lb 12 oz (99.7 kg)   SpO2 98%   BMI 41.52 kg/m   Physical Exam   Assessment and Plan    There are no diagnoses linked to this encounter.   No Follow-up on file. Clarnce Flock Ersa, RMA 07/03/2017

## 2017-07-03 NOTE — Patient Instructions (Signed)
Vitamin D Deficiency Vitamin D deficiency is when your body does not have enough vitamin D. Vitamin D is important because:  It helps your body use other minerals that your body needs.  It helps keep your bones strong and healthy.  It may help to prevent some diseases.  It helps your heart and other muscles work well.  You can get vitamin D by:  Eating foods with vitamin D in them.  Drinking or eating milk or other foods that have had vitamin D added to them.  Taking a vitamin D supplement.  Being in the sun.  Not getting enough vitamin D can make your bones become soft. It can also cause other health problems. Follow these instructions at home:  Take medicines and supplements only as told by your doctor.  Eat foods that have vitamin D. These include: ? Dairy products, cereals, or juices with added vitamin D. Check the label for vitamin D. ? Fatty fish like salmon or trout. ? Eggs. ? Oysters.  Do not use tanning beds.  Stay at a healthy weight. Lose weight, if needed.  Keep all follow-up visits as told by your doctor. This is important. Contact a doctor if:  Your symptoms do not go away.  You feel sick to your stomach (nauseous).  Youthrow up (vomit).  You poop less often than usual or you have trouble pooping (constipation). This information is not intended to replace advice given to you by your health care provider. Make sure you discuss any questions you have with your health care provider. Document Released: 08/10/2011 Document Revised: 01/27/2016 Document Reviewed: 01/06/2015 Elsevier Interactive Patient Education  2018 [place]. Calcium; Vitamin D chewable tablet What is this medicine? CALCIUM; VITAMIN D (KAL see um; VYE ta min D) is a vitamin supplement. It is used to prevent conditions of low calcium and vitamin D. This medicine may be used for other purposes; ask your health care provider or pharmacist if you have questions. COMMON BRAND NAME(S):  Calcet Citrate Soft Chew, Caltrate 600+D, Citracal Calcium, Citracal Creamy Bites, Os-Cal 500 Plus D, OSCAL with Vitamin D3, Oysco 500 + D What should I tell my health care provider before I take this medicine? They need to know if you have any of these conditions: -constipation -dehydration -heart disease -high level of calcium or vitamin D in the blood -high level of phosphate in the blood -kidney disease -kidney stones -liver disease -parathyroid disease -sarcoidosis -stomach ulcer or obstruction -an unusual or allergic reaction to calcium, vitamin D, tartrazine dye, other medicines, foods, dyes, or preservatives -pregnant or trying to get pregnant -breast-feeding How should I use this medicine? Take this medicine by mouth. Chew it completely before swallowing. Follow the directions on the prescription label. Take with food or within 1 hour after a meal. Take your medicine at regular intervals. Do not take your medicine more often than directed. Talk to your pediatrician regarding the use of this medicine in children. While this medicine may be used in children for selected conditions, precautions do apply. Overdosage: If you think you have taken too much of this medicine contact a poison control center or emergency room at once. NOTE: This medicine is only for you. Do not share this medicine with others. What if I miss a dose? If you miss a dose, take it as soon as you can. If it is almost time for your next dose, take only that dose. Do not take double or extra doses. What may interact with this  medicine? Do not take this medicine with any of the following medications: -ammonium chloride -methenamine This medicine may also interact with the following medications: -antibiotics like ciprofloxacin, gatifloxacin, tetracycline -captopril -delavirdine -diuretics -gabapentin -iron supplements -medicines for fungal infections like ketoconazole and itraconazole -medicines for seizures  like ethotoin and phenytoin -mineral oil -mycophenolate -other vitamins with calcium, vitamin D, or minerals -quinidine -rosuvastatin -sucralfate -thyroid medicine This list may not describe all possible interactions. Give your health care provider a list of all the medicines, herbs, non-prescription drugs, or dietary supplements you use. Also tell them if you smoke, drink alcohol, or use illegal drugs. Some items may interact with your medicine. What should I watch for while using this medicine? Taking this medicine is not a substitute for a well-balanced diet and exercise. Talk with your doctor or health care provider and follow a healthy lifestyle. Do not take this medicine with high-fiber foods, large amounts of alcohol, or drinks containing caffeine. Do not take this medicine within 2 hours of any other medicines. What side effects may I notice from receiving this medicine? Side effects that you should report to your doctor or health care professional as soon as possible: -allergic reactions like skin rash, itching or hives, swelling of the face, lips, or tongue -confusion -dry mouth -high blood pressure -increased hunger or thirst -increased urination -irregular heartbeat -metallic taste -muscle or bone pain -pain when urinating -seizure -unusually weak or tired -weight loss Side effects that usually do not require medical attention (report to your doctor or health care professional if they continue or are bothersome): -constipation -diarrhea -headache -loss of appetite -nausea, vomiting -stomach upset This list may not describe all possible side effects. Call your doctor for medical advice about side effects. You may report side effects to FDA at 1-800-FDA-1088. Where should I keep my medicine? Keep out of the reach of children. Store at room temperature between 15 and 30 degrees C (59 and 86 degrees F). Protect from light. Keep container tightly closed. Throw away any  unused medicine after the expiration date. NOTE: This sheet is a summary. It may not cover all possible information. If you have questions about this medicine, talk to your doctor, pharmacist, or health care provider.  2018 Elsevier/Gold Standard (2007-12-04 17:57:27)

## 2017-07-12 ENCOUNTER — Ambulatory Visit (HOSPITAL_COMMUNITY)
Admission: RE | Admit: 2017-07-12 | Discharge: 2017-07-12 | Disposition: A | Discharge status: Home / Self Care | Payer: BLUE CROSS/BLUE SHIELD | Source: Ambulatory Visit | Attending: Family Medicine | Admitting: Family Medicine

## 2017-07-12 DIAGNOSIS — Z1231 Encounter for screening mammogram for malignant neoplasm of breast: Secondary | ICD-10-CM | POA: Diagnosis not present

## 2017-07-12 DIAGNOSIS — E049 Nontoxic goiter, unspecified: Secondary | ICD-10-CM

## 2017-07-12 DIAGNOSIS — E042 Nontoxic multinodular goiter: Secondary | ICD-10-CM | POA: Diagnosis not present

## 2017-07-13 ENCOUNTER — Telehealth: Payer: Self-pay | Admitting: Family Medicine

## 2017-07-13 DIAGNOSIS — E041 Nontoxic single thyroid nodule: Secondary | ICD-10-CM

## 2017-07-13 NOTE — Telephone Encounter (Signed)
Please call patient and advised that her thyroid ultrasound revealed 4 nodules.  3 of the 4 nodules that were on the ultrasound need to have a fine-needle aspirate or biopsy.  Please advise her that I am placing a referral for the biopsy to be performed.  Please tell her to let me know if she does not get a call regarding scheduling of this test.  Please ask her to make sure she has a follow-up appointment with me to discuss these test.

## 2017-07-13 NOTE — Telephone Encounter (Signed)
Patient informed of message below, verbalized understanding.  

## 2017-07-13 NOTE — Telephone Encounter (Signed)
Called patient regarding message below. No answer, left generic message for patient to return call.   

## 2017-07-18 ENCOUNTER — Other Ambulatory Visit: Payer: Self-pay | Admitting: Family Medicine

## 2017-07-24 ENCOUNTER — Other Ambulatory Visit: Payer: Self-pay | Admitting: Family Medicine

## 2017-07-24 DIAGNOSIS — E041 Nontoxic single thyroid nodule: Secondary | ICD-10-CM

## 2017-07-25 ENCOUNTER — Ambulatory Visit (HOSPITAL_COMMUNITY)
Admission: RE | Admit: 2017-07-25 | Discharge: 2017-07-25 | Disposition: A | Discharge status: Home / Self Care | Payer: BLUE CROSS/BLUE SHIELD | Source: Ambulatory Visit | Attending: Family Medicine | Admitting: Family Medicine

## 2017-07-25 ENCOUNTER — Encounter (HOSPITAL_COMMUNITY): Payer: Self-pay

## 2017-07-25 DIAGNOSIS — E042 Nontoxic multinodular goiter: Secondary | ICD-10-CM | POA: Diagnosis not present

## 2017-07-25 DIAGNOSIS — E041 Nontoxic single thyroid nodule: Secondary | ICD-10-CM | POA: Diagnosis not present

## 2017-07-25 MED ORDER — LIDOCAINE HCL (PF) 2 % IJ SOLN
INTRAMUSCULAR | Status: AC
  Administered 2017-07-25: 12:00:00
  Filled 2017-07-25: qty 10

## 2017-07-25 NOTE — Discharge Instructions (Signed)
Thyroid Biopsy °The thyroid gland is a butterfly-shaped gland located in the front of the neck. It produces hormones that affect metabolism, growth and development, and body temperature. Thyroid biopsy is a procedure in which small samples of tissue or fluid are removed from the thyroid gland. The samples are then looked at under a microscope to check for abnormalities. This procedure is done to determine the cause of thyroid problems. It may be done to check for infection, cancer, or other thyroid problems. °Two methods may be used for a thyroid biopsy. In one method, a thin needle is inserted through the skin and into the thyroid gland. In the other method, an open incision is made through the skin. °Tell a health care provider about: °· Any allergies you have. °· All medicines you are taking, including vitamins, herbs, eye drops, creams, and over-the-counter medicines. °· Any problems you or family members have had with anesthetic medicines. °· Any blood disorders you have. °· Any surgeries you have had. °· Any medical conditions you have. °What are the risks? °Generally, this is a safe procedure. However, problems can occur and include: °· Bleeding from the procedure site. °· Infection. °· Injury to structures near the thyroid gland. ° °What happens before the procedure? °· Ask your health care provider about: °? Changing or stopping your regular medicines. This is especially important if you are taking diabetes medicines or blood thinners. °? Taking medicines such as aspirin and ibuprofen. These medicines can thin your blood. Do not take these medicines before your procedure if your health care provider asks you not to. °· Do not eat or drink anything after midnight on the night before the procedure or as directed by your health care provider. °· You may have a blood sample taken. °What happens during the procedure? °Either of these methods may be used to perform a thyroid biopsy: °· Fine needle biopsy. You may  be given medicine to help you relax (sedative). You will be asked to lie on your back with your head tipped backward to extend your neck. An area on your neck will be cleaned. A needle will then be inserted through the skin of your neck. You may be asked to avoid coughing, talking, swallowing, or making sounds during some portions of the procedure. The needle will be withdrawn once the tissue or fluid samples have been removed. Pressure may be applied to your neck to reduce swelling and ensure that bleeding has stopped. The samples will be sent to a lab for examination. °· Open biopsy. You will be given medicine to make you sleep (general anesthetic). An incision will be made in your neck. A sample of thyroid tissue will be removed using surgical tools. The tissue sample will be sent for examination. In some cases, the sample may be examined during the biopsy. If that is done and cancer cells are found, some or all of the thyroid gland may be removed. The incision will be closed with stitches. ° °What happens after the procedure? °· Your recovery will be assessed and monitored. °· You may have soreness and tenderness at the site of the biopsy. This should go away after a few days. °· If you had an open biopsy, you may have a hoarse voice or sore throat for a couple days. °· It is your responsibility to get your test results. °This information is not intended to replace advice given to you by your health care provider. Make sure you discuss any questions you have with   your health care provider. °Document Released: 06/18/2007 Document Revised: 04/23/2016 Document Reviewed: 11/13/2013 °Elsevier Interactive Patient Education © 2018 Elsevier Inc. ° °

## 2017-07-30 NOTE — Telephone Encounter (Signed)
Patient called requesting the results to her biopsy. Please advise

## 2017-07-31 ENCOUNTER — Telehealth: Payer: Self-pay | Admitting: Family Medicine

## 2017-07-31 NOTE — Telephone Encounter (Signed)
-----   Message from Caren Macadam, MD sent at 07/31/2017 12:38 PM EST ----- Please call and advise patient that the thyroid nodule biopsy revealed benign follicular cells.  Please advise her to keep her follow-up appointment to discuss.

## 2017-07-31 NOTE — Telephone Encounter (Signed)
We will let her know once we have received the results.

## 2017-07-31 NOTE — Telephone Encounter (Signed)
Called patient regarding message below. No answer, unable to leave message.  

## 2017-07-31 NOTE — Telephone Encounter (Signed)
Patient called left message regarding message below, I called patient to make her aware of Dr Mannie Stabile stated, patient did not answer I left a message.

## 2017-08-01 ENCOUNTER — Telehealth: Payer: Self-pay | Admitting: Family Medicine

## 2017-08-01 ENCOUNTER — Other Ambulatory Visit: Payer: Self-pay

## 2017-08-01 ENCOUNTER — Ambulatory Visit (INDEPENDENT_AMBULATORY_CARE_PROVIDER_SITE_OTHER): Payer: BLUE CROSS/BLUE SHIELD | Admitting: Family Medicine

## 2017-08-01 ENCOUNTER — Encounter: Payer: Self-pay | Admitting: Family Medicine

## 2017-08-01 VITALS — BP 120/84 | HR 68 | Temp 98.8°F | Resp 16 | Ht 61.0 in | Wt 216.0 lb

## 2017-08-01 DIAGNOSIS — E042 Nontoxic multinodular goiter: Secondary | ICD-10-CM | POA: Diagnosis not present

## 2017-08-01 DIAGNOSIS — I10 Essential (primary) hypertension: Secondary | ICD-10-CM | POA: Diagnosis not present

## 2017-08-01 DIAGNOSIS — E119 Type 2 diabetes mellitus without complications: Secondary | ICD-10-CM | POA: Insufficient documentation

## 2017-08-01 DIAGNOSIS — E782 Mixed hyperlipidemia: Secondary | ICD-10-CM

## 2017-08-01 MED ORDER — LORAZEPAM 0.5 MG PO TABS: ORAL_TABLET | ORAL | 0 refills | Status: DC

## 2017-08-01 NOTE — Telephone Encounter (Signed)
Error

## 2017-08-01 NOTE — Telephone Encounter (Signed)
Please call patient and advise that I spoke with the thyroid surgeon's office, Dr. Barrett Shell.  Their office recommended that she receive her biopsies at [place].  She should get the biopsies there and then pending the biopsy she would need to be seen and evaluated in his office.  Please advise her that I have placed a referral for the fine-needle aspirate and ultrasound.  Please tell her that I will give her a call after we receive the ultrasound results.

## 2017-08-01 NOTE — Progress Notes (Signed)
Patient ID: Maria Fisher, female    DOB: Mar 08, 1956, 61 y.o.   MRN: [id number]  Chief Complaint  Patient presents with  . Follow-up    Allergies Patient has no known allergies.  Subjective:   Maria Fisher is a 61 y.o. female who presents to [facility] today.  HPI Ms. Veale presents today with her family including her husband and her daughter to discuss her thyroid ultrasound.  She reports she has been feeling well.  She has been taking her blood pressure medication and her diabetic medication.  She reports her energy level is good.  Denies any swelling in her feet.  Denies any chest pain, shortness of breath, or palpitations.  Reports she went and had the ultrasound and subsequent fine-needle aspirate of the thyroid biopsies.  She would like to discuss the ultrasound and the biopsies today.    Past Medical History:  Diagnosis Date  . Back pain   . Diabetes mellitus without complication ([place])   . Heart murmur   . Hyperlipidemia   . Hypertension   . Neck pain   . Neuromuscular disorder ([place])   . Shoulder pain, bilateral     Past Surgical History:  Procedure Laterality Date  . ABDOMINAL HYSTERECTOMY    . CESAREAN SECTION     x 2  . CHOLECYSTECTOMY      Family History  Problem Relation Age of Onset  . Diabetes Mother   . Heart disease Mother   . Kidney disease Mother   . Depression Mother   . Hypertension Mother   . Hyperlipidemia Mother   . Cirrhosis Father   . Alcohol abuse Father   . Diabetes Sister   . Hypertension Sister   . Hypertension Brother   . Hypertension Daughter   . Hypertension Son      Social History   Socioeconomic History  . Marital status: Married    Spouse name: None  . Number of children: 2  . Years of education: 70  . Highest education level: None  Social Needs  . Financial resource strain: None  . Food insecurity - worry: None  . Food insecurity - inability: None  . Transportation needs - medical: None  .  Transportation needs - non-medical: None  Occupational History  . Occupation: Child care  Tobacco Use  . Smoking status: Never Smoker  . Smokeless tobacco: Never Used  Substance and Sexual Activity  . Alcohol use: No  . Drug use: No  . Sexual activity: Not Currently    Partners: Male  Other Topics Concern  . None  Social History Narrative   Lives at home with her husband. Married.    Right-handed.   3 cups coffee most days. Eats all food groups.    Does walking for exercise.    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    Enjoys gong to [employer] and [employer].   Has two children.     Review of Systems  Constitutional: Negative for chills, fatigue and fever.  HENT: Negative for trouble swallowing and voice change.   Eyes: Negative for visual disturbance.  Respiratory: Negative for cough, choking, chest tightness and shortness of breath.   Cardiovascular: Negative for chest pain, palpitations and leg swelling.  Gastrointestinal: Negative for diarrhea and nausea.  Endocrine: Negative for polyphagia and polyuria.  Skin: Negative for rash.  Neurological: Negative for numbness and headaches.  Hematological: Negative for adenopathy. Does not bruise/bleed easily.  Objective:   BP 120/84 (BP Location: Left Arm, Patient Position: Sitting, Cuff Size: Normal)   Pulse 68   Temp 98.8 F (37.1 C) (Other (Comment))   Resp 16   Ht 5\' 1"  (1.549 m)   Wt 216 lb (98 kg)   SpO2 97%   BMI 40.81 kg/m   Physical Exam  Constitutional: She is oriented to person, place, and time. She appears well-developed and well-nourished.  HENT:  Head: Normocephalic and atraumatic.  Eyes: EOM are normal. Pupils are equal, round, and reactive to light. No scleral icterus.  Neck: Normal range of motion. Neck supple. No JVD present. No tracheal deviation present. Thyromegaly present.  Cardiovascular: Normal rate and regular rhythm.  Pulmonary/Chest: Effort normal and breath sounds normal.    Abdominal: Soft. Bowel sounds are normal.  Musculoskeletal: She exhibits no edema.  Lymphadenopathy:    She has no cervical adenopathy.  Neurological: She is alert and oriented to person, place, and time. No cranial nerve deficit.  Skin: Skin is warm and dry. No rash noted.  Psychiatric: She has a normal mood and affect. Her behavior is normal. Judgment and thought content normal.  Vitals reviewed.    Assessment and Plan  1. Multiple thyroid nodules Discussed with patient today the thyroid ultrasound and FNA results.  We reviewed the ultrasound characteristics of each of the 5 nodules.  We discussed that 4 of the 5 nodules were moderately suspicious and needed FNA.  Patient reported anxiety with having to undergo the FNA and again because of the unsuccessful attempts at the last procedure.  However patient and her family definitely would like the rest of the nodules biopsied to ensure no malignancy.  After long discussion patient will get the FNA done.  I will call and find out the recommended place for thyroid biopsies and then schedule and contact patient.  In addition today we discussed that she will take Ativan 0.5 mg 30 minutes prior to the biopsy and she will repeat again if needed.  This prescription was called in today.  All the questions of the family were answered today.  Office visit was greater than 25 minutes and greater than 50% of office visit was spent counseling and coordinating care.  Counseling was given face-to-face.  Patient counseled in detail regarding the risks of medication. Told to call or return to clinic if develop any worrisome signs or symptoms. Patient voiced understanding.  Patient will not be driving to biopsy.  2. HTN, goal below 130/80 Stable.  Continue current management.  3. Mixed hyperlipidemia Continue medication.  4. Type 2 diabetes mellitus without complication, without long-term current use of insulin (HCC) Compliance and continuation of medication  was discussed.  We will plan on rechecking A1c and other labs in January, 2019.  Diet exercise and weight loss was discussed with patient.  She will plan on following up in 2 months or sooner if needed.  Patient adamantly defers vaccinations today.  We will plan on doing foot exam at follow-up.  She reports she did receive a call regarding her ophthalmology visit.  However, she reports that she just cannot deal with the appointments at this time due to to the thyroid issues.   Return in about 2 months (around 10/01/2017) for diabetes/bp/hld. Caren Macadam, MD 08/01/2017

## 2017-08-02 ENCOUNTER — Telehealth: Payer: Self-pay | Admitting: Family Medicine

## 2017-08-02 NOTE — Telephone Encounter (Signed)
I called [facility] yesterday and they ask why patient needs a biopsy if she has already had the nodules biopsied. She does think they can use the [place] from [facility] as long as it was within the last year. Please advise so I can clarify

## 2017-08-02 NOTE — Telephone Encounter (Signed)
Patient informed of message below, verbalized understanding.   She does ask to speak to Dr. Mannie Stabile on Tuesday to clarify.

## 2017-08-02 NOTE — Telephone Encounter (Signed)
Called patient per Dr. Mannie Stabile. Patient has 4 thyroid nodules of moderate suspicion, 2 of which were biopsied. As a general rule, if two of the nodules are fine, the other 2 will be as well, but patient has the option of having other two biopsied as well due to suspicion. Dr. Mannie Stabile has spoken to Dr. Harlow Asa and radiologists and the same team of people who did her last biopsy can do this one, if she wishes to do it, and she can do at [facility].   I left a message for patient to return call. Patient can wait to speak to Dr. Mannie Stabile on Tuesday for clarity if she wishes.

## 2017-08-07 ENCOUNTER — Telehealth: Payer: Self-pay | Admitting: Family Medicine

## 2017-08-07 ENCOUNTER — Telehealth: Payer: Self-pay | Admitting: *Deleted

## 2017-08-07 DIAGNOSIS — E041 Nontoxic single thyroid nodule: Secondary | ICD-10-CM

## 2017-08-07 NOTE — Telephone Encounter (Signed)
Called and left message on voice message for patient to call office and that I would try back around 8 in the  Morning if I did not hear from here.  Gwen Her. Mannie Stabile, MD

## 2017-08-07 NOTE — Telephone Encounter (Signed)
Patient called back.  We discussed today on the phone the fact that 2 of the 4 nodules were biopsied.  We discussed that the same people would be doing the biopsy in [place] as would do the biopsy at [place].  She wishes to have the biopsy performed at [facility].  Patient reports that she would like to proceed with having all 4 nodules biopsied to ensure they are within normal limits and to give herself peace of mind.  I am in agreement with this and told her that I would place the referral for the FNA of the other nodules.  I did tell patient that I had discussed this with Dr. Bowles/radiology and that he was in agreement that if patient wished to have the nodules biopsy for peace of mind that this was definitely acceptable.  We also discussed that after I get the results of the biopsy back that I will call patient with the results and further referral or intervention is necessary at this time that we will discuss that via the telephone.  Patient was in agreement.  Her questions were answered.  And I will talk with her after the biopsy is performed.   Please schedule and contact patient. Gwen Her. Mannie Stabile, MD

## 2017-08-07 NOTE — Telephone Encounter (Signed)
Pt called left message that Dr Mannie Stabile was suppose to call her regarding going to [facility] for a Biopsy. [phone]

## 2017-08-08 NOTE — Telephone Encounter (Signed)
Patient informed of message below, verbalized understanding. Appt scheduled. 

## 2017-08-09 ENCOUNTER — Other Ambulatory Visit: Payer: Self-pay | Admitting: Family Medicine

## 2017-08-09 DIAGNOSIS — E041 Nontoxic single thyroid nodule: Secondary | ICD-10-CM

## 2017-08-15 ENCOUNTER — Ambulatory Visit (HOSPITAL_COMMUNITY)
Admission: RE | Admit: 2017-08-15 | Discharge: 2017-08-15 | Disposition: A | Discharge status: Home / Self Care | Payer: BLUE CROSS/BLUE SHIELD | Source: Ambulatory Visit | Attending: Family Medicine | Admitting: Family Medicine

## 2017-08-15 ENCOUNTER — Encounter (HOSPITAL_COMMUNITY): Payer: Self-pay

## 2017-08-15 DIAGNOSIS — E079 Disorder of thyroid, unspecified: Secondary | ICD-10-CM | POA: Diagnosis not present

## 2017-08-15 DIAGNOSIS — E041 Nontoxic single thyroid nodule: Secondary | ICD-10-CM | POA: Insufficient documentation

## 2017-08-15 DIAGNOSIS — E042 Nontoxic multinodular goiter: Secondary | ICD-10-CM | POA: Diagnosis not present

## 2017-08-15 MED ORDER — LIDOCAINE HCL (PF) 2 % IJ SOLN
INTRAMUSCULAR | Status: AC
  Administered 2017-08-15: 10 mL
  Filled 2017-08-15: qty 10

## 2017-08-15 MED ORDER — LIDOCAINE HCL (PF) 2 % IJ SOLN
INTRAMUSCULAR | Status: AC
  Administered 2017-08-15: 10 mL via SUBCUTANEOUS
  Filled 2017-08-15: qty 10

## 2017-08-20 ENCOUNTER — Telehealth: Payer: Self-pay | Admitting: Family Medicine

## 2017-08-20 DIAGNOSIS — E041 Nontoxic single thyroid nodule: Secondary | ICD-10-CM

## 2017-08-20 NOTE — Telephone Encounter (Signed)
Called patient to discuss thyroid biopsy results revealing follicular lesion of undetermined significance.  Message left on machine for patient to call back at her earliest convenience.  Referral has been placed to Dr.Todd Gerkin.   I called and spoke with Claiborne Billings, Dr. Gala Lewandowsky nurse, and he usually does see patient for this discussion regarding the thyroid nodule biopsy result of FLUS.  She reports that they usually have discussion with patient and discuss ultrasound monitoring versus performing molecular genetics testing.  Referral was placed and will get patient scheduled after discussion with patient regarding her need to see surgery.

## 2017-08-20 NOTE — Telephone Encounter (Signed)
Patient called back.  I spoke with her on the phone this afternoon.  We discussed the 2 additional biopsy reports.  I talked with her regarding the biopsy report which revealed follicular cells of undetermined significance.  Told her that at this time the next step in management would be to refer her to a surgeon.  Reportedly he will discuss with her molecular genetic testing versus surveillance via ultrasound.  He will discuss the pros and cons of both and further management.  She was agreement with this plan.  I told her that her referral was made to Dr. Armandina Gemma.  She should receive this information regarding the referral.  I told her that if she does not hear back regarding the referral within the next week that she needs to call our office.  She voiced understanding.  Her questions were answered.  She will keep her regularly scheduled follow-ups in our office regarding her other medical problems.

## 2017-08-21 ENCOUNTER — Telehealth: Payer: Self-pay | Admitting: Family Medicine

## 2017-08-21 NOTE — Telephone Encounter (Signed)
Patient states that she does not think a specific doctor was mentioned but she knows that Dr Mannie Stabile told her that if she was still having problems with the weakness, she would make her a referral. She states she was told this during an office visit. I have looked through all encounters r/t care at [facility], no mention of hand weakness. I do see where Dr. Krista Blue treated her for hand weakness. Tried to call patient back to advise to call Dr Krista Blue as he is neuro. Unable to reach patient

## 2017-08-21 NOTE — Telephone Encounter (Signed)
Please call patient and advise that I do not recall discussing thing with her. She can follow up with Dr. Roney Jaffe. I am happy to see her and see what is going on regarding this weakness.

## 2017-08-21 NOTE — Telephone Encounter (Signed)
Weakness in both hands, Dr Mannie Stabile said for her to call back if no better for a referral..[phone], she has an appt Jan 9th surgeon on Neck

## 2017-08-22 NOTE — Telephone Encounter (Signed)
Called and spoke to Maria Fisher, is ok waiting til 1 9 19  to see dr Animator.

## 2017-09-12 ENCOUNTER — Ambulatory Visit: Payer: BLUE CROSS/BLUE SHIELD | Admitting: Family Medicine

## 2017-09-12 DIAGNOSIS — D440 Neoplasm of uncertain behavior of thyroid gland: Secondary | ICD-10-CM | POA: Diagnosis not present

## 2017-09-12 DIAGNOSIS — E042 Nontoxic multinodular goiter: Secondary | ICD-10-CM | POA: Diagnosis not present

## 2017-09-14 ENCOUNTER — Other Ambulatory Visit: Payer: Self-pay | Admitting: Family Medicine

## 2017-09-17 ENCOUNTER — Other Ambulatory Visit: Payer: Self-pay | Admitting: Surgery

## 2017-09-17 DIAGNOSIS — E042 Nontoxic multinodular goiter: Secondary | ICD-10-CM

## 2017-09-19 ENCOUNTER — Encounter: Payer: Self-pay | Admitting: Family Medicine

## 2017-09-19 ENCOUNTER — Ambulatory Visit (INDEPENDENT_AMBULATORY_CARE_PROVIDER_SITE_OTHER): Payer: BLUE CROSS/BLUE SHIELD | Admitting: Family Medicine

## 2017-09-19 ENCOUNTER — Other Ambulatory Visit: Payer: Self-pay

## 2017-09-19 VITALS — BP 124/78 | HR 81 | Temp 97.9°F | Resp 16 | Ht 61.0 in | Wt 222.5 lb

## 2017-09-19 DIAGNOSIS — E119 Type 2 diabetes mellitus without complications: Secondary | ICD-10-CM

## 2017-09-19 DIAGNOSIS — Z79899 Other long term (current) drug therapy: Secondary | ICD-10-CM | POA: Diagnosis not present

## 2017-09-19 DIAGNOSIS — G5603 Carpal tunnel syndrome, bilateral upper limbs: Secondary | ICD-10-CM

## 2017-09-19 DIAGNOSIS — I10 Essential (primary) hypertension: Secondary | ICD-10-CM

## 2017-09-19 DIAGNOSIS — E782 Mixed hyperlipidemia: Secondary | ICD-10-CM | POA: Diagnosis not present

## 2017-09-19 MED ORDER — METFORMIN HCL 1000 MG PO TABS: 1000.0000 mg | ORAL_TABLET | Freq: Two times a day (BID) | ORAL | 2 refills | Status: DC

## 2017-09-19 MED ORDER — LISINOPRIL-HYDROCHLOROTHIAZIDE 20-25 MG PO TABS: 1.0000 | ORAL_TABLET | Freq: Every day | ORAL | 1 refills | Status: DC
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
## 2017-09-19 NOTE — Patient Instructions (Signed)
Follow up in 2 months Come to lab a few days before appointment and get blood work done so we can discuss it at follow up  Referral placed for hand/ortho if you do not hear about referral in 2-3 days, call ME!

## 2017-09-21 ENCOUNTER — Telehealth: Payer: Self-pay | Admitting: Family Medicine

## 2017-09-21 NOTE — Telephone Encounter (Signed)
Returned patients call, no answer, left voicemail. Patients referral for hand surgery has been done to Dr.Jones as she requested. (I called and confirmed that he does this as well)

## 2017-09-25 ENCOUNTER — Ambulatory Visit
Admission: RE | Admit: 2017-09-25 | Discharge: 2017-09-25 | Disposition: A | Discharge status: Home / Self Care | Payer: BLUE CROSS/BLUE SHIELD | Source: Ambulatory Visit | Attending: Surgery | Admitting: Surgery

## 2017-09-25 DIAGNOSIS — E041 Nontoxic single thyroid nodule: Secondary | ICD-10-CM | POA: Diagnosis not present

## 2017-09-25 DIAGNOSIS — E042 Nontoxic multinodular goiter: Secondary | ICD-10-CM

## 2017-09-28 DIAGNOSIS — E042 Nontoxic multinodular goiter: Secondary | ICD-10-CM | POA: Diagnosis not present

## 2017-10-08 ENCOUNTER — Encounter (HOSPITAL_COMMUNITY): Payer: Self-pay

## 2017-10-09 DIAGNOSIS — Z6841 Body Mass Index (BMI) 40.0 and over, adult: Secondary | ICD-10-CM | POA: Diagnosis not present

## 2017-10-09 DIAGNOSIS — I10 Essential (primary) hypertension: Secondary | ICD-10-CM | POA: Diagnosis not present

## 2017-10-09 DIAGNOSIS — G5603 Carpal tunnel syndrome, bilateral upper limbs: Secondary | ICD-10-CM | POA: Diagnosis not present

## 2017-10-15 ENCOUNTER — Telehealth: Payer: Self-pay | Admitting: Family Medicine

## 2017-10-15 NOTE — Telephone Encounter (Signed)
Patient called to ask if you have received any information about her thyroid biopsy  Cb#: [phone]

## 2017-10-15 NOTE — Telephone Encounter (Signed)
ERROR

## 2017-10-16 NOTE — Telephone Encounter (Signed)
Advise that I have not received the results and most likely the doctor will have her come back to his office for a follow up to discuss the results. Gwen Her. Mannie Stabile, MD

## 2017-10-17 NOTE — Telephone Encounter (Signed)
Called patient. She has just heard from the other doctor. The biopsy was not cancerous. They will continue to monitor.

## 2017-10-23 ENCOUNTER — Encounter (HOSPITAL_COMMUNITY): Payer: Self-pay | Admitting: Emergency Medicine

## 2017-10-23 ENCOUNTER — Emergency Department (HOSPITAL_COMMUNITY)
Admission: EM | Admit: 2017-10-23 | Discharge: 2017-10-23 | Disposition: A | Discharge status: Home / Self Care | Payer: BLUE CROSS/BLUE SHIELD | Attending: Emergency Medicine | Admitting: Emergency Medicine

## 2017-10-23 ENCOUNTER — Emergency Department (HOSPITAL_COMMUNITY): Payer: BLUE CROSS/BLUE SHIELD

## 2017-10-23 ENCOUNTER — Other Ambulatory Visit: Payer: Self-pay

## 2017-10-23 DIAGNOSIS — I10 Essential (primary) hypertension: Secondary | ICD-10-CM | POA: Insufficient documentation

## 2017-10-23 DIAGNOSIS — Z7984 Long term (current) use of oral hypoglycemic drugs: Secondary | ICD-10-CM | POA: Diagnosis not present

## 2017-10-23 DIAGNOSIS — E1165 Type 2 diabetes mellitus with hyperglycemia: Secondary | ICD-10-CM | POA: Diagnosis not present

## 2017-10-23 DIAGNOSIS — R51 Headache: Secondary | ICD-10-CM | POA: Diagnosis not present

## 2017-10-23 DIAGNOSIS — R739 Hyperglycemia, unspecified: Secondary | ICD-10-CM

## 2017-10-23 LAB — CBC WITH DIFFERENTIAL/PLATELET
BASOS ABS: 0 10*3/uL (ref 0.0–0.1)
BASOS PCT: 0 %
EOS ABS: 0.1 10*3/uL (ref 0.0–0.7)
EOS PCT: 1 %
HCT: 40.8 % (ref 36.0–46.0)
Hemoglobin: 13.6 g/dL (ref 12.0–15.0)
LYMPHS ABS: 2.6 10*3/uL (ref 0.7–4.0)
Lymphocytes Relative: 27 %
MCH: 27.5 pg (ref 26.0–34.0)
MCHC: 33.3 g/dL (ref 30.0–36.0)
MCV: 82.4 fL (ref 78.0–100.0)
Monocytes Absolute: 0.5 10*3/uL (ref 0.1–1.0)
Monocytes Relative: 5 %
Neutro Abs: 6.5 10*3/uL (ref 1.7–7.7)
Neutrophils Relative %: 67 %
PLATELETS: 327 10*3/uL (ref 150–400)
RBC: 4.95 MIL/uL (ref 3.87–5.11)
RDW: 12.7 % (ref 11.5–15.5)
WBC: 9.7 10*3/uL (ref 4.0–10.5)

## 2017-10-23 LAB — URINALYSIS, ROUTINE W REFLEX MICROSCOPIC
BILIRUBIN URINE: NEGATIVE
Glucose, UA: >=500 mg/dL — AB
HGB URINE DIPSTICK: NEGATIVE
Ketones, ur: NEGATIVE mg/dL
LEUKOCYTES UA: NEGATIVE
NITRITE: NEGATIVE
PROTEIN: NEGATIVE mg/dL
Specific Gravity, Urine: 1.018 (ref 1.005–1.030)
pH: 5 (ref 5.0–8.0)

## 2017-10-23 LAB — BASIC METABOLIC PANEL
ANION GAP: 14 (ref 5–15)
BUN: 33 mg/dL — ABNORMAL HIGH (ref 6–20)
CHLORIDE: 88 mmol/L — AB (ref 101–111)
CO2: 26 mmol/L (ref 22–32)
Calcium: 11 mg/dL — ABNORMAL HIGH (ref 8.9–10.3)
Creatinine, Ser: 1.44 mg/dL — ABNORMAL HIGH (ref 0.44–1.00)
GFR, EST AFRICAN AMERICAN: 44 mL/min — AB (ref >60)
GFR, EST NON AFRICAN AMERICAN: 38 mL/min — AB (ref >60)
Glucose, Bld: 601 mg/dL (ref 65–99)
POTASSIUM: 4.7 mmol/L (ref 3.5–5.1)
SODIUM: 128 mmol/L — AB (ref 135–145)

## 2017-10-23 LAB — CBG MONITORING, ED
GLUCOSE-CAPILLARY: 340 mg/dL — AB (ref 65–99)
Glucose-Capillary: 346 mg/dL — ABNORMAL HIGH (ref 65–99)

## 2017-10-23 LAB — TROPONIN I: Troponin I: <0.03 ng/mL (ref <0.03)

## 2017-10-23 MED ORDER — ONDANSETRON HCL 4 MG PO TABS: 4.0000 mg | ORAL_TABLET | Freq: Three times a day (TID) | ORAL | 0 refills | Status: DC | PRN

## 2017-10-23 MED ORDER — SODIUM CHLORIDE 0.9 % IV BOLUS (SEPSIS)
2000.0000 mL | Freq: Once | INTRAVENOUS | Status: AC
  Administered 2017-10-23: 2000 mL via INTRAVENOUS

## 2017-10-23 MED ORDER — GLYBURIDE 5 MG PO TABS
5.0000 mg | ORAL_TABLET | Freq: Two times a day (BID) | ORAL | 0 refills | Status: DC
Start: 2017-10-23 — End: 2017-10-24

## 2017-10-23 MED ORDER — INSULIN ASPART 100 UNIT/ML ~~LOC~~ SOLN
5.0000 [IU] | Freq: Once | SUBCUTANEOUS | Status: AC
  Administered 2017-10-23: 5 [IU] via INTRAVENOUS
  Filled 2017-10-23: qty 1

## 2017-10-23 NOTE — ED Triage Notes (Signed)
States bg was 600 at home.  States she has been nauseated, blurred vision and slight headache.  Just laying around feeling bad.

## 2017-10-23 NOTE — ED Notes (Signed)
Patient denies pain and is resting comfortably.  

## 2017-10-23 NOTE — ED Notes (Signed)
Pt returned from xray

## 2017-10-23 NOTE — ED Notes (Signed)
Date and time results received: 10/23/17 1:26 PM  (use smartphrase ".now" to insert current time)  Test: Glucose Critical Value: 601  Name of Provider Notified: [place]  Orders Received? Or Actions Taken?: Orders Received - See Orders for details

## 2017-10-23 NOTE — ED Provider Notes (Signed)
Emergency Department Provider Note   I have reviewed the triage vital signs and the nursing notes.   HISTORY  Chief Complaint Hyperglycemia   HPI Maria Fisher is a 62 y.o. female with a history of hyperlipidemia, hypertension and diabetes on metformin the presents to the emergency department today secondary to hyperglycemia.  Patient states that for the last couple weeks she is felt bad with polyuria polydipsia a little bit of cough and runny nose but no fevers.  No chest pain, back pain or shortness of breath.  She missed her doses of metformin for the last few days secondary to "life things".  Checked her blood sugar today and it was over 600 so she came here for further evaluation. No other associated or modifying symptoms.    Past Medical History:  Diagnosis Date  . Back pain   . Diabetes mellitus without complication ([place])   . Heart murmur   . Hyperlipidemia   . Hypertension   . Neck pain   . Neuromuscular disorder ([place])   . Shoulder pain, bilateral     Patient Active Problem List   Diagnosis Date Noted  . Type 2 diabetes mellitus without complication, without long-term current use of insulin ([place]) 08/01/2017  . HLD (hyperlipidemia) 07/03/2017  . Type 2 diabetes mellitus with hyperglycemia ([place]) 06/12/2017  . HTN, goal below 130/80 06/12/2017  . CTS (carpal tunnel syndrome) 02/14/2017  . Neck pain 01/22/2017  . Hand weakness 01/22/2017  . Low back pain 01/22/2017    Past Surgical History:  Procedure Laterality Date  . ABDOMINAL HYSTERECTOMY    . CESAREAN SECTION     x 2  . CHOLECYSTECTOMY      Current Outpatient Rx  . Order #: [id number] Class: Historical Med  . Order #: [id number] Class: Historical Med  . Order #: [id number] Class: Historical Med  . Order #: [id number] Class: Normal  . Order #: [id number] Class: Normal  . Order #: [id number] Class: Normal  . Order #: [id number] Class: Print  . Order #: [id number] Class: Normal    Allergies Patient has no known  allergies.  Family History  Problem Relation Age of Onset  . Diabetes Mother   . Heart disease Mother   . Kidney disease Mother   . Depression Mother   . Hypertension Mother   . Hyperlipidemia Mother   . Cirrhosis Father   . Alcohol abuse Father   . Diabetes Sister   . Hypertension Sister   . Hypertension Brother   . Hypertension Daughter   . Hypertension Son     Social History Social History   Tobacco Use  . Smoking status: Never Smoker  . Smokeless tobacco: Never Used  Substance Use Topics  . Alcohol use: No  . Drug use: No    Review of Systems  All other systems negative except as documented in the HPI. All pertinent positives and negatives as reviewed in the HPI. ____________________________________________   PHYSICAL EXAM:  VITAL SIGNS: ED Triage Vitals  Enc Vitals Group     BP 10/23/17 1144 136/81     Pulse Rate 10/23/17 1144 (!) 114     Resp 10/23/17 1144 18     Temp 10/23/17 1144 98.5 F (36.9 C)     Temp Source 10/23/17 1144 Oral     SpO2 10/23/17 1144 98 %     Weight 10/23/17 1146 215 lb (97.5 kg)     Height 10/23/17 1146 5\' 1"  (1.549 m)    Constitutional: Alert and oriented. Well appearing and in  no acute distress. Eyes: Conjunctivae are normal. PERRL. EOMI. Head: Atraumatic. Nose: No congestion/rhinnorhea. Mouth/Throat: Mucous membranes are moist.  Oropharynx non-erythematous. Neck: No stridor.  No meningeal signs.   Cardiovascular: Tachycardic rate, regular rhythm. Good peripheral circulation. Grossly normal heart sounds.   Respiratory: Normal respiratory effort.  No retractions. Lungs CTAB. Gastrointestinal: Soft and nontender. No distention.  Musculoskeletal: No lower extremity tenderness nor edema. No gross deformities of extremities. Neurologic:  Normal speech and language. No gross focal neurologic deficits are appreciated.  Skin:  Skin is warm, dry and intact. No rash noted.  ____________________________________________    LABS (all labs ordered are listed, but only abnormal results are displayed)  Labs Reviewed  BASIC METABOLIC PANEL - Abnormal; Notable for the following components:      Result Value   Sodium 128 (*)    Chloride 88 (*)    Glucose, Bld 601 (*)    BUN 33 (*)    Creatinine, Ser 1.44 (*)    Calcium 11.0 (*)    GFR calc non Af Amer 38 (*)    GFR calc Af Amer 44 (*)    All other components within normal limits  URINALYSIS, ROUTINE W REFLEX MICROSCOPIC - Abnormal; Notable for the following components:   Color, Urine STRAW (*)    Glucose, UA >=500 (*)    Bacteria, UA RARE (*)    Squamous Epithelial / LPF 0-5 (*)    All other components within normal limits  CBG MONITORING, ED - Abnormal; Notable for the following components:   Glucose-Capillary >600 (*)    All other components within normal limits  CBG MONITORING, ED - Abnormal; Notable for the following components:   Glucose-Capillary 346 (*)    All other components within normal limits  CBG MONITORING, ED - Abnormal; Notable for the following components:   Glucose-Capillary 340 (*)    All other components within normal limits  CBC WITH DIFFERENTIAL/PLATELET  TROPONIN I   ____________________________________________  EKG   EKG Interpretation  Date/Time:  Tuesday October 23 2017 15:39:30 EST Ventricular Rate:  98 PR Interval:    QRS Duration: 96 QT Interval:  327 QTC Calculation: 418 R Axis:   -47 Text Interpretation:  Sinus rhythm Probable left atrial enlargement Left anterior fascicular block Abnormal R-wave progression, late transition Left ventricular hypertrophy rate faster otherwise similar to previous Confirmed by Theotis Burrow [phone]) on 10/24/2017 2:45:13 PM       ____________________________________________  RADIOLOGY  No results found.  ____________________________________________   PROCEDURES  Procedure(s) performed:   Procedures   ____________________________________________   INITIAL  IMPRESSION / ASSESSMENT AND PLAN / ED COURSE  Hyperglycemia of unclear etiology but likely exacerbated by noncompliance over the last few days.  Will evaluate for infection, ischemia and other causes but no symptoms to suggest anything else at this point. Discussed with Dr. Mannie Stabile who suggests starting glyburide at 5 mg and calling office for follow up.  Blood sugar improved. Symptoms improved. Appears well. Will dc per above instructions.    Pertinent labs & imaging results that were available during my care of the patient were reviewed by me and considered in my medical decision making (see chart for details).  ____________________________________________  FINAL CLINICAL IMPRESSION(S) / ED DIAGNOSES  Final diagnoses:  Hyperglycemia     MEDICATIONS GIVEN DURING THIS VISIT:  Medications  sodium chloride 0.9 % bolus 2,000 mL (0 mLs Intravenous Stopped 10/23/17 1808)  insulin aspart (novoLOG) injection 5 Units (5 Units Intravenous Given 10/23/17 1449)  NEW OUTPATIENT MEDICATIONS STARTED DURING THIS VISIT:  Discharge Medication List as of 10/23/2017  4:22 PM    START taking these medications   Details  ondansetron (ZOFRAN) 4 MG tablet Take 1 tablet (4 mg total) by mouth every 8 (eight) hours as needed for nausea or vomiting., Starting Tue 10/23/2017, Print    glyBURIDE (DIABETA) 5 MG tablet Take 1 tablet (5 mg total) by mouth 2 (two) times daily with a meal., Starting Tue 10/23/2017, Print        Note:  This note was prepared with assistance of Dragon voice recognition software. Occasional wrong-word or sound-a-like substitutions may have occurred due to the inherent limitations of voice recognition software.   Merrily Pew, MD 10/24/17 [id number]

## 2017-10-24 ENCOUNTER — Telehealth: Payer: Self-pay

## 2017-10-24 ENCOUNTER — Ambulatory Visit: Payer: BLUE CROSS/BLUE SHIELD | Admitting: Family Medicine

## 2017-10-24 ENCOUNTER — Ambulatory Visit (INDEPENDENT_AMBULATORY_CARE_PROVIDER_SITE_OTHER): Payer: BLUE CROSS/BLUE SHIELD | Admitting: Family Medicine

## 2017-10-24 ENCOUNTER — Encounter: Payer: Self-pay | Admitting: Family Medicine

## 2017-10-24 VITALS — BP 120/78 | HR 86 | Temp 98.6°F | Resp 16

## 2017-10-24 DIAGNOSIS — R110 Nausea: Secondary | ICD-10-CM | POA: Diagnosis not present

## 2017-10-24 DIAGNOSIS — E1165 Type 2 diabetes mellitus with hyperglycemia: Secondary | ICD-10-CM

## 2017-10-24 LAB — GLUCOSE, POCT (MANUAL RESULT ENTRY): POC Glucose: 489 mg/dl — AB (ref 70–99)

## 2017-10-24 MED ORDER — ONDANSETRON HCL 4 MG PO TABS: 4.0000 mg | ORAL_TABLET | Freq: Three times a day (TID) | ORAL | 0 refills | Status: DC | PRN

## 2017-10-24 MED ORDER — GLIPIZIDE ER 5 MG PO TB24: 5.0000 mg | ORAL_TABLET | Freq: Every day | ORAL | 0 refills | Status: DC

## 2017-10-24 NOTE — Telephone Encounter (Signed)
Pt called wanting WI today regarding blood sugar.  We do have time today and I called her back and lmom to call me back to get a WI appr today

## 2017-10-24 NOTE — Progress Notes (Signed)
Patient ID: Maria Fisher, female    DOB: 1956-02-20, 62 y.o.   MRN: [id number]  Chief Complaint  Patient presents with  . Diabetes    Allergies Patient has no known allergies.  Subjective:   Maria Fisher is a 62 y.o. female who presents to [facility] today.  HPI Patient presents to the office this morning because she reports that she thought she had a scheduled visit after going to the emergency department yesterday.  She reports that she was seen in the emergency department because she had not been feeling well for quite a few days and checked her blood sugar and it was around 600.  She reports that she had not been taking her diabetic medication for several days because she was not feeling great.  She reports that she was very thirsty and urinating a lot therefore she was drinking lots of fruit juice and sodas.  She reports that she had several episodes of emesis so she went to the emergency department for evaluation.  She was seen in the emergency department and laboratory studies were performed which revealed elevated blood sugars.  Her urinalysis was checked which did not reveal any evidence of infection.  A chest x-ray was performed which was within normal limits and troponin was performed at to rule out any cardiac abnormalities.  She reports that she had had some stress going on and therefore was not taking her diabetic medications.  Usually her hemoglobin A1c is fairly well controlled.  I told the patient that I was actually called by the emergency department yesterday regarding her.  She had refused initiation of insulin yesterday in the emergency department and therefore they did call to discuss starting her on p.o. medication.  She was given a prescription for glyburide from the emergency department yesterday.  She reports that she did not start the medication.  She reports that she did not take her metformin yesterday evening because she was concerned that her blood  sugar might get too low because she had been given insulin in the emergency department.  She reports that she has not had any subsequent vomiting since she was in the emergency department.  She reports that she still feels a little bit nauseated and does not feel great.  She ate breakfast and then realized that she needed to check her sugars so she checked her sugar and it was approximately 400 this morning.  Therefore she came to the office to be seen.  She denies any chest pain, shortness of breath, or swelling in her extremities.  She reports that she is urinating well.  Denies any tremors or hypoglycemia.  She denies any vision changes.  She reports that she is breathing well.  She denies any swelling in her extremities.  She reports that she just feels tired and nauseated.  She was given a prescription for Zofran in the emergency department yesterday but she did not get this medication filled.  She denies any fevers, chills, vomiting or diarrhea today.  She has had no diarrhea yesterday.  He denies any skin rashes or lesions.  No lesions on her feet.  She reports that she has not been compliant with all her medications over the past several weeks.  Last hemoglobin A1c was checked on June 26, 2017 which was 7.3%.  Labs were ordered at her last visit, however she did not return to get her blood work in January.    Past Medical History:  Diagnosis  Date  . Back pain   . Diabetes mellitus without complication ([place])   . Heart murmur   . Hyperlipidemia   . Hypertension   . Neck pain   . Neuromuscular disorder ([place])   . Shoulder pain, bilateral     Past Surgical History:  Procedure Laterality Date  . ABDOMINAL HYSTERECTOMY    . CESAREAN SECTION     x 2  . CHOLECYSTECTOMY      Family History  Problem Relation Age of Onset  . Diabetes Mother   . Heart disease Mother   . Kidney disease Mother   . Depression Mother   . Hypertension Mother   . Hyperlipidemia Mother   . Cirrhosis Father     . Alcohol abuse Father   . Diabetes Sister   . Hypertension Sister   . Hypertension Brother   . Hypertension Daughter   . Hypertension Son      Social History   Socioeconomic History  . Marital status: Married    Spouse name: None  . Number of children: 2  . Years of education: 52  . Highest education level: None  Social Needs  . Financial resource strain: None  . Food insecurity - worry: None  . Food insecurity - inability: None  . Transportation needs - medical: None  . Transportation needs - non-medical: None  Occupational History  . Occupation: Child care  Tobacco Use  . Smoking status: Never Smoker  . Smokeless tobacco: Never Used  Substance and Sexual Activity  . Alcohol use: No  . Drug use: No  . Sexual activity: Not Currently    Partners: Male  Other Topics Concern  . None  Social History Narrative   Lives at home with her husband. Married.    Right-handed.   3 cups coffee most days. Eats all food groups.    Does walking for exercise.    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    Enjoys gong to [employer] and [employer].   Has two children.     Review of Systems  Constitutional: Positive for fatigue. Negative for activity change, appetite change, chills and fever.  HENT: Negative for congestion, ear discharge, nosebleeds, postnasal drip, sinus pressure, sneezing and sore throat.   Eyes: Negative for visual disturbance.  Respiratory: Negative for cough, chest tightness and shortness of breath.   Cardiovascular: Negative for chest pain, palpitations and leg swelling.  Gastrointestinal: Positive for rectal pain. Negative for abdominal pain, nausea and vomiting.  Genitourinary: Negative for difficulty urinating, dyspareunia, dysuria, frequency, hematuria and urgency.  Musculoskeletal: Negative for arthralgias and myalgias.  Skin: Negative for rash.  Neurological: Negative for dizziness, syncope, weakness and light-headedness.  Hematological:  Negative for adenopathy.  Psychiatric/Behavioral: Negative for agitation and dysphoric mood. The patient is not nervous/anxious.      Objective:   BP 120/78 (BP Location: Left Arm, Patient Position: Sitting, Cuff Size: Normal)   Pulse (!) 104   Temp 98.6 F (37 C) (Temporal)   Resp 16   SpO2 95%   Physical Exam  Constitutional: She is oriented to person, place, and time. She appears well-developed and well-nourished.  HENT:  Head: Normocephalic and atraumatic.  Nose: Nose normal.  Mouth/Throat: Oropharynx is clear and moist. No oropharyngeal exudate.  Eyes: Conjunctivae and EOM are normal. Pupils are equal, round, and reactive to light. No scleral icterus.  Neck: Normal range of motion. Neck supple. No JVD present.  Cardiovascular: Normal rate, regular rhythm and normal heart sounds.  Pulmonary/Chest: Effort normal and breath sounds normal. No stridor. No respiratory distress. She has no wheezes.  Abdominal: Soft. Bowel sounds are normal. She exhibits no distension.  Neurological: She is alert and oriented to person, place, and time.  Skin: Skin is warm and dry. Capillary refill takes less than 2 seconds. No erythema.  Psychiatric: She has a normal mood and affect. Her behavior is normal. Judgment and thought content normal.     Assessment and Plan   1. Type 2 diabetes mellitus with hyperglycemia, without long-term current use of insulin ([place]) -Patient with a history of diabetes mellitus type 2, last hemoglobin A1c of 7.3% in October/2018, who presented to the emergency department yesterday with sugars around 500 with symptoms of hyperglycemia.  She was given insulin and IV fluids in the emergency department and did feel better.  She was discharged home with instructions to take her metformin and to add additional diabetic medication.  She was prescribed glyburide twice a day yesterday in the emergency department.  Will discontinue that medication at this time.  She has not gotten  it filled yet.  Will start Glucotrol XL 5 mg 1 p.o. daily.  In addition will give her a shot of insulin, 8 units regular at this time to bring her sugars down.  She was encouraged to take her metformin.  She was given a meter today in the office.  She will check her blood sugars twice a day for the next 2 days and will call me with the readings.  She was told if she developed vomiting, sugars greater than 400, or any worrisome signs and symptoms that she needs to go to the emergency department or if it was during business hours to please call our office.  She does agree to call me tomorrow morning with her readings.  She is very hesitant on starting any insulin at this time.  She does report noncompliance with medication and with her diet.  She reports that she was not aware that sodas and fruit juices would elevate her sugars substantially.  We did do some diabetic education in the office today.  She does report that she went to diabetic education formal classes at the hospital approximately 6 years ago.  She is interested in some counseling and therapy at this time.  Her husband is also diabetic and she would like for them to go to the classes together.  In addition to counseling on hyperglycemic symptoms, patient was also counseled on symptoms of hypoglycemia and treatment of low blood sugars. - glipiZIDE (GLUCOTROL XL) 5 MG 24 hr tablet; Take 1 tablet (5 mg total) by mouth daily with breakfast.  Dispense: 30 tablet; Refill: 0 - Amb ref to Medical Nutrition Therapy-MNT -Patient was asked to get lab work done prior to her follow-up visit. 2. Nausea I discussed with patient that I do believe her nausea is secondary to her elevated blood sugars.  She brought in the prescription, paper, from the emergency department yesterday for the Zofran.  I went ahead and sent in a prescription to her pharmacy for the Zofran.Patient counseled in detail regarding the risks of medication. Told to call or return to clinic if  develop any worrisome signs or symptoms. Patient voiced understanding.  I did discuss with patient that the nausea is a symptom of elevated blood sugars.  I told her that if the nausea is worsening or if she is developing any other symptoms that go along with this that I need to know.  She agrees to check her blood sugar and call me with the readings.  She is going to make the dietary changes that we have discussed.  She is going to follow-up in 2 weeks.  We will check lab work at that time. - ondansetron (ZOFRAN) 4 MG tablet; Take 1 tablet (4 mg total) by mouth every 8 (eight) hours as needed for nausea.  Dispense: 20 tablet; Refill: 0 Office visit was  25-30 minutes today.  Greater than 50% of office visit was spent counseling and coordinating care. Emergency department records reviewed. Return in about 2 weeks (around 11/07/2017) for Follow-up. Caren Macadam, MD 10/24/2017

## 2017-10-25 ENCOUNTER — Telehealth: Payer: Self-pay | Admitting: Family Medicine

## 2017-10-25 NOTE — Telephone Encounter (Signed)
Patient wanted to know how she could lower her blood sugar. I advised her to take her meds and avoid starchy foods and walking or physical activity can help. You requested her glucose readings and she wants a call back tomorrow

## 2017-10-25 NOTE — Telephone Encounter (Signed)
Patient called in with blood sugar readings for today: 307 before meal/medication  9am - : 464  11:15am- : 475 2:21pm--:  346 3:40pm-: 332

## 2017-10-25 NOTE — Telephone Encounter (Signed)
Please advise patient that this is the first day of her taking both of her diabetic medications.  She needs to continue her medicines, keep a diet record, make sure she is not eating a lot of "white" foods, such as breads, Vigna, pasta, potatoes.  In addition, she needs to make sure she is not drinking juices or sodas.  She should not be drinking sweet tea.  She can eat grilled chicken and vegetables.  She should be getting a call regarding her diabetic nutrition counseling.  Please check on the status of this referral.  Patient needs to make sure when she is recording her sugars they are 2 hours after eating or before eating or first thing in the morning.  No further intervention needed at this time.  If she is willing we can get her into the office and I can put her on some basal insulin which will get her blood sugars down faster.  She has not wanted to do any insulin at this point.  I will likely increase the Glucotrol XL next week.  I hope she is feeling better.  Please let us know how she is doing

## 2017-10-26 NOTE — Telephone Encounter (Signed)
Left message per DPR 

## 2017-11-06 ENCOUNTER — Other Ambulatory Visit: Payer: Self-pay | Admitting: Family Medicine

## 2017-11-06 MED ORDER — GLUCOSE BLOOD VI STRP: 1.0000 | ORAL_STRIP | Freq: Two times a day (BID) | 12 refills | Status: DC

## 2017-11-07 ENCOUNTER — Other Ambulatory Visit: Payer: Self-pay

## 2017-11-07 ENCOUNTER — Ambulatory Visit (INDEPENDENT_AMBULATORY_CARE_PROVIDER_SITE_OTHER): Payer: BLUE CROSS/BLUE SHIELD | Admitting: Family Medicine

## 2017-11-07 ENCOUNTER — Encounter: Payer: Self-pay | Admitting: Family Medicine

## 2017-11-07 VITALS — BP 124/80 | HR 74 | Temp 98.0°F | Resp 16 | Ht 61.0 in | Wt 211.0 lb

## 2017-11-07 DIAGNOSIS — R110 Nausea: Secondary | ICD-10-CM

## 2017-11-07 DIAGNOSIS — E1165 Type 2 diabetes mellitus with hyperglycemia: Secondary | ICD-10-CM

## 2017-11-07 DIAGNOSIS — R1013 Epigastric pain: Secondary | ICD-10-CM | POA: Diagnosis not present

## 2017-11-07 MED ORDER — BLOOD GLUCOSE MONITOR KIT: PACK | 0 refills | Status: DC

## 2017-11-07 MED ORDER — PANTOPRAZOLE SODIUM 40 MG PO TBEC: 40.0000 mg | DELAYED_RELEASE_TABLET | Freq: Every day | ORAL | 3 refills | Status: DC

## 2017-11-07 MED ORDER — GLIPIZIDE ER 10 MG PO TB24: 10.0000 mg | ORAL_TABLET | Freq: Every day | ORAL | 1 refills | Status: DC

## 2017-11-07 NOTE — Patient Instructions (Signed)
Get lab tests today, checking for stomach bacteria that can cause nausea and heartburn.  Start the protonix 40 mg a day, 30 min before largest meal, for acid/heartburn  Increase the glucotrol xl to 10 mg a day. Stop the 5 mg tablet.

## 2017-11-07 NOTE — Progress Notes (Signed)
Patient ID: Maria Fisher, female    DOB: Sep 22, 1955, 62 y.o.   MRN: [id number]  Chief Complaint  Patient presents with  . Follow-up    Allergies Patient has no known allergies.  Subjective:   Maria Fisher is a 62 y.o. female who presents to [facility] today.  HPI Maria Fisher presents here for follow up.   She has been checking her blood sugars at home and before breakfast sugars range 200- 207.  Two hours after eating, sugars are elevated, but she is not sure if she is waiting 2 hours after eating to check the sugars. Has quit juice and soda.  Has been trying to eat less fried food.  Has not been to diabetic education. Scheduled for April with the diabetic educator and nutritionist.  Has appointment with eye doctor in [place], [place].     She believes that she has lost weight not because she is eating a lot healthier but because she is not eating much food because she feels nauseated.  Reports that feels nausea when wakes up or when goes to eat. Feels like can eat then when start eating feel nauseated. Feels like will vomit but does not vomit. Has had one  episode of gagging, where would vomit some white liquid, that has only occurred once. Has been experiencing heartburn. Heartburn after eating but also during the day.  Reports that she has a sour taste in mouth. Is having some regurgitation of liquid back into her mouth. Has burning sensation in esophagus and chest. Has the dyspepsia throughout the day.  Has occasionally woken up with heartburn.  Denies cough.  Using OTC tums, once a day, and it helps. No belching. No blood in stool. No melena. No abdominal pain.  Denies any pain in her chest or shortness of breath.       Past Medical History:  Diagnosis Date  . Back pain   . Diabetes mellitus without complication ([place])   . Heart murmur   . Hyperlipidemia   . Hypertension   . Neck pain   . Neuromuscular disorder ([place])   . Shoulder pain, bilateral     Past  Surgical History:  Procedure Laterality Date  . ABDOMINAL HYSTERECTOMY    . CESAREAN SECTION     x 2  . CHOLECYSTECTOMY      Family History  Problem Relation Age of Onset  . Diabetes Mother   . Heart disease Mother   . Kidney disease Mother   . Depression Mother   . Hypertension Mother   . Hyperlipidemia Mother   . Cirrhosis Father   . Alcohol abuse Father   . Diabetes Sister   . Hypertension Sister   . Hypertension Brother   . Hypertension Daughter   . Hypertension Son      Social History   Socioeconomic History  . Marital status: Married    Spouse name: Not on file  . Number of children: 2  . Years of education: 53  . Highest education level: Not on file  Social Needs  . Financial resource strain: Not on file  . Food insecurity - worry: Not on file  . Food insecurity - inability: Not on file  . Transportation needs - medical: Not on file  . Transportation needs - non-medical: Not on file  Occupational History  . Occupation: Child care  Tobacco Use  . Smoking status: Never Smoker  . Smokeless tobacco: Never Used  Substance and Sexual Activity  .  Alcohol use: No  . Drug use: No  . Sexual activity: Not Currently    Partners: Male  Other Topics Concern  . Not on file  Social History Narrative   Lives at home with her husband. Married.    Right-handed.   3 cups coffee most days. Eats all food groups.    Does walking for exercise.    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    Enjoys gong to [employer] and [employer].   Has two children.     Review of Systems  Constitutional: Positive for appetite change and unexpected weight change. Negative for activity change, chills and fatigue.  HENT: Negative for trouble swallowing and voice change.   Respiratory: Negative for cough, chest tightness, shortness of breath and wheezing.   Gastrointestinal: Negative for abdominal distention, anal bleeding, blood in stool, constipation, diarrhea, nausea, rectal  pain and vomiting.  Endocrine: Negative for polyphagia and polyuria.  Genitourinary: Negative for dysuria.  Skin: Negative for rash.  Neurological: Negative for dizziness, tremors, weakness, light-headedness and headaches.  Psychiatric/Behavioral: Negative for behavioral problems, dysphoric mood and sleep disturbance. The patient is not nervous/anxious.      Objective:   BP 124/80 (BP Location: Left Arm, Patient Position: Sitting, Cuff Size: Normal)   Pulse 74   Temp 98 F (36.7 C) (Temporal)   Resp 16   Ht '5\' 1"'  (1.549 m)   Wt 211 lb (95.7 kg)   SpO2 98%   BMI 39.87 kg/m   Physical Exam  Constitutional: She is oriented to person, place, and time. She appears well-developed and well-nourished.  HENT:  Head: Normocephalic and atraumatic.  Eyes: Conjunctivae and EOM are normal. Pupils are equal, round, and reactive to light. No scleral icterus.  Cardiovascular: Normal rate and regular rhythm.  Abdominal: Soft. Bowel sounds are normal. She exhibits no distension. There is no hepatosplenomegaly. There is no tenderness.  Neurological: She is alert and oriented to person, place, and time. No cranial nerve deficit.  Skin: Skin is warm. No rash noted.  Psychiatric: She has a normal mood and affect. Her behavior is normal. Judgment and thought content normal.  Vitals reviewed.    Assessment and Plan  1. Dyspepsia Suspect at this time the dyspepsia symptoms and nausea secondary to reflux.  Will check for H. pylori at this time.  If H. pylori is negative, refer to GI for upper endoscopy secondary to decreased appetite and weight loss.  Weight loss could be accounted for partially due to the fact the patient has cut out sodas and juice and has been trying to work on her diet.  However she is also not been eating a regular diet because of the nausea and dyspepsia. - H. pylori breath test - pantoprazole (PROTONIX) 40 MG tablet; Take 1 tablet (40 mg total) by mouth daily.  Dispense: 30  tablet; Refill: 3 - Ambulatory referral to Gastroenterology A trial of Protonix at this time.Patient counseled in detail regarding the risks of medication. Told to call or return to clinic if develop any worrisome signs or symptoms. Patient voiced understanding.  -Patient was counseled concerning worrisome signs and symptoms of dyspepsia and heartburn.  She was told if she experiences the symptoms to please call, return to clinic, or go to the emergency department.  She voiced understanding.  Continue all medications as directed. 2. Nausea -Patient with persistent nausea.  Possibly secondary to reflux, H. pylori, medication, possible diabetic gastroparesis.  Will check liver tests and blood work  today.  Trial of Protonix and check for H. pylori.  Referred to GI. - pantoprazole (PROTONIX) 40 MG tablet; Take 1 tablet (40 mg total) by mouth daily.  Dispense: 30 tablet; Refill: 3 - COMPLETE METABOLIC PANEL WITH GFR - Lipase - Amylase - Ambulatory referral to Gastroenterology  3. Type 2 diabetes mellitus with hyperglycemia, without long-term current use of insulin ([facility]) Increase Glucotrol at this time.  Continue current medications and keep scheduled appointment with diabetic education/nutritionist.  Prescription for glucometer sent to pharmacy per patient request. Diet, exercise, foot care discussed. Keep appointment next week with eye doctor for diabetic eye exam. We will plan to check hemoglobin A1c in 2 months. - glipiZIDE (GLUCOTROL XL) 10 MG 24 hr tablet; Take 1 tablet (10 mg total) by mouth daily with breakfast.  Dispense: 30 tablet; Refill: 1 - blood glucose meter kit and supplies KIT; Dispense based on patient and insurance preference. Use up to four times daily as directed. (FOR ICD-9 250.00, 250.01).  Dispense: 1 each; Refill: 0  Return for Regularly scheduled follow-up. Caren Macadam, MD 11/07/2017

## 2017-11-08 DIAGNOSIS — R110 Nausea: Secondary | ICD-10-CM | POA: Diagnosis not present

## 2017-11-09 DIAGNOSIS — E119 Type 2 diabetes mellitus without complications: Secondary | ICD-10-CM | POA: Diagnosis not present

## 2017-11-09 LAB — COMPLETE METABOLIC PANEL WITH GFR
AG Ratio: 1 (calc) (ref 1.0–2.5)
ALBUMIN MSPROF: 3.8 g/dL (ref 3.6–5.1)
ALKALINE PHOSPHATASE (APISO): 115 U/L (ref 33–130)
ALT: 43 U/L — ABNORMAL HIGH (ref 6–29)
AST: 41 U/L — AB (ref 10–35)
BUN / CREAT RATIO: 17 (calc) (ref 6–22)
BUN: 21 mg/dL (ref 7–25)
CALCIUM: 10.1 mg/dL (ref 8.6–10.4)
CO2: 28 mmol/L (ref 20–32)
CREATININE: 1.21 mg/dL — AB (ref 0.50–0.99)
Chloride: 105 mmol/L (ref 98–110)
GFR, EST NON AFRICAN AMERICAN: 48 mL/min/{1.73_m2} — AB (ref >=60)
GFR, Est African American: 55 mL/min/{1.73_m2} — ABNORMAL LOW (ref >=60)
GLUCOSE: 149 mg/dL — AB (ref 65–99)
Globulin: 3.7 g/dL (calc) (ref 1.9–3.7)
Potassium: 4.7 mmol/L (ref 3.5–5.3)
Sodium: 141 mmol/L (ref 135–146)
Total Bilirubin: 0.4 mg/dL (ref 0.2–1.2)
Total Protein: 7.5 g/dL (ref 6.1–8.1)

## 2017-11-09 LAB — LIPID PANEL
Cholesterol: 107 mg/dL (ref <200)
HDL: 47 mg/dL — AB (ref >50)
LDL CHOLESTEROL (CALC): 44 mg/dL
Non-HDL Cholesterol (Calc): 60 mg/dL (calc) (ref <130)
TRIGLYCERIDES: 77 mg/dL (ref <150)
Total CHOL/HDL Ratio: 2.3 (calc) (ref <5.0)

## 2017-11-09 LAB — LIPASE: LIPASE: 76 U/L — AB (ref 7–60)

## 2017-11-09 LAB — HEMOGLOBIN A1C
HEMOGLOBIN A1C: 13.1 %{Hb} — AB (ref <5.7)
MEAN PLASMA GLUCOSE: 329 (calc)
eAG (mmol/L): 18.2 (calc)

## 2017-11-09 LAB — AMYLASE: AMYLASE: 93 U/L (ref 21–101)

## 2017-11-12 ENCOUNTER — Encounter: Payer: Self-pay | Admitting: Gastroenterology

## 2017-11-12 DIAGNOSIS — M13841 Other specified arthritis, right hand: Secondary | ICD-10-CM | POA: Diagnosis not present

## 2017-11-12 DIAGNOSIS — G5601 Carpal tunnel syndrome, right upper limb: Secondary | ICD-10-CM | POA: Diagnosis not present

## 2017-11-12 DIAGNOSIS — G5602 Carpal tunnel syndrome, left upper limb: Secondary | ICD-10-CM | POA: Diagnosis not present

## 2017-11-13 ENCOUNTER — Ambulatory Visit: Payer: BLUE CROSS/BLUE SHIELD | Admitting: Neurology

## 2017-11-13 ENCOUNTER — Telehealth: Payer: Self-pay | Admitting: Family Medicine

## 2017-11-13 NOTE — Telephone Encounter (Signed)
Please call the lab and check on the status of the H. pylori breath test.  I have not received the results from this test back  as of yet.  In addition, please call patient and see how she is feeling.  Advise her that her hemoglobin A1c was very elevated at approximately 13%.  This is an average sugar of about 330.  Please call and ask how her sugars are running over the past several days.  Does she have an upcoming appointment with gastroenterology?  Does she have an upcoming appointment with GI?  Advised her that her liver tests were slightly elevated.  I suspect this is due to fatty liver and elevated blood sugars.  Please let me know how she is doing and I will advise after hearing back on your report.Gwen Her. Mannie Stabile, MD

## 2017-11-14 NOTE — Telephone Encounter (Signed)
Called patient regarding message below. No answer, unable to leave message.  

## 2017-11-19 NOTE — Telephone Encounter (Signed)
Called patient regarding message below. No answer, left generic message for patient to return call.   

## 2017-11-20 ENCOUNTER — Other Ambulatory Visit: Payer: Self-pay

## 2017-11-20 ENCOUNTER — Ambulatory Visit: Payer: BLUE CROSS/BLUE SHIELD | Admitting: Family Medicine

## 2017-11-20 ENCOUNTER — Encounter: Payer: Self-pay | Admitting: Family Medicine

## 2017-11-20 VITALS — BP 130/78 | HR 83 | Temp 97.9°F | Resp 16 | Ht 61.0 in | Wt 207.8 lb

## 2017-11-20 DIAGNOSIS — I10 Essential (primary) hypertension: Secondary | ICD-10-CM

## 2017-11-20 DIAGNOSIS — R1013 Epigastric pain: Secondary | ICD-10-CM

## 2017-11-20 DIAGNOSIS — E119 Type 2 diabetes mellitus without complications: Secondary | ICD-10-CM

## 2017-11-20 DIAGNOSIS — Z23 Encounter for immunization: Secondary | ICD-10-CM | POA: Diagnosis not present

## 2017-11-20 DIAGNOSIS — R110 Nausea: Secondary | ICD-10-CM | POA: Insufficient documentation

## 2017-11-20 NOTE — Progress Notes (Signed)
Patient ID: Maria Fisher, female    DOB: Sep 22, 1955, 62 y.o.   MRN: [id number]  Chief Complaint  Patient presents with  . Follow-up    Allergies Patient has no known allergies.  Subjective:   Maria Fisher is a 62 y.o. female who presents to [facility] today.  HPI Maria Fisher presents today for follow-up visit regarding her diabetes and her dyspepsia.  She reports that she is still had some intermittent nausea, reflux, and she has lost several more pounds.  She reports that she went to the lab to get the H. pylori testing performed but because she had been taking Tums they told her she could not have the test done.  They told her she had to wait 3 weeks and not to take the Protonix and come back for the test.  She reports that she has not been taking the Protonix for her reflux or dyspepsia.  She does have an upcoming appointment with Dr. Oneida Alar for evaluation.  She reports she still does get brash water taste in her mouth and have food come back up into her mouth.  Denies any chest pain.  Still feeling nausea but no vomiting.  She reports that her blood sugars have been lower over the past several days.  Her sugars have been in the 60s-140s in the morning.  She has not felt as nauseated but still has had some nausea.  She denies any hypoglycemic episodes.  She denies any lesions on her feet.  She does not have any side effects per her report with her diabetes medications.  She did increase the Glucotrol XL to 10 mg a day.  Her appetite is fair.  She reports she is cut out juices and tea since her last visit here.  She has an upcoming appointment to see the diabetic nutritionist.  She has not had her pneumonia shot.  She reports that her blood pressure has been running well at home.  Denies any chest pain, shortness of breath, or swelling in her extremities.    Past Medical History:  Diagnosis Date  . Back pain   . Diabetes mellitus without complication ([place])   . Heart  murmur   . Hyperlipidemia   . Hypertension   . Neck pain   . Neuromuscular disorder ([place])   . Shoulder pain, bilateral     Past Surgical History:  Procedure Laterality Date  . ABDOMINAL HYSTERECTOMY    . CESAREAN SECTION     x 2  . CHOLECYSTECTOMY      Family History  Problem Relation Age of Onset  . Diabetes Mother   . Heart disease Mother   . Kidney disease Mother   . Depression Mother   . Hypertension Mother   . Hyperlipidemia Mother   . Cirrhosis Father   . Alcohol abuse Father   . Diabetes Sister   . Hypertension Sister   . Hypertension Brother   . Hypertension Daughter   . Hypertension Son      Social History   Socioeconomic History  . Marital status: Married    Spouse name: None  . Number of children: 2  . Years of education: 71  . Highest education level: None  Social Needs  . Financial resource strain: None  . Food insecurity - worry: None  . Food insecurity - inability: None  . Transportation needs - medical: None  . Transportation needs - non-medical: None  Occupational History  . Occupation: Child care  Tobacco Use  . Smoking status: Never Smoker  . Smokeless tobacco: Never Used  Substance and Sexual Activity  . Alcohol use: No  . Drug use: No  . Sexual activity: Not Currently    Partners: Male  Other Topics Concern  . None  Social History Narrative   Lives at home with her husband. Married.    Right-handed.   3 cups coffee most days. Eats all food groups.    Does walking for exercise.    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    Enjoys gong to [employer] and [employer].   Has two children.    Current Outpatient Medications on File Prior to Visit  Medication Sig Dispense Refill  . aspirin EC 81 MG tablet Take 81 mg by mouth daily.    Marland Kitchen atorvastatin (LIPITOR) 40 MG tablet Take 40 mg by mouth daily.     . blood glucose meter kit and supplies KIT Dispense based on patient and insurance preference. Use up to four times daily as  directed. (FOR ICD-9 250.00, 250.01). 1 each 0  . calcium-vitamin D 250-100 MG-UNIT tablet Take 1 tablet by mouth 2 (two) times daily.    Marland Kitchen glipiZIDE (GLUCOTROL XL) 10 MG 24 hr tablet Take 1 tablet (10 mg total) by mouth daily with breakfast. 30 tablet 1  . glucose blood (CONTOUR NEXT TEST) test strip 1 each by Other route 2 (two) times daily. Use as instructed 100 each 12  . lisinopril-hydrochlorothiazide (PRINZIDE,ZESTORETIC) 20-25 MG tablet Take 1 tablet by mouth daily. 90 tablet 1  . metFORMIN (GLUCOPHAGE) 1000 MG tablet Take 1 tablet (1,000 mg total) by mouth 2 (two) times daily with a meal. 180 tablet 2  . ondansetron (ZOFRAN) 4 MG tablet Take 1 tablet (4 mg total) by mouth every 8 (eight) hours as needed for nausea or vomiting. 12 tablet 0  . ondansetron (ZOFRAN) 4 MG tablet Take 1 tablet (4 mg total) by mouth every 8 (eight) hours as needed for nausea. 20 tablet 0  . pantoprazole (PROTONIX) 40 MG tablet Take 1 tablet (40 mg total) by mouth daily. (Patient not taking: Reported on 11/20/2017) 30 tablet 3   No current facility-administered medications on file prior to visit.     Review of Systems   Objective:   BP 130/82 (BP Location: Left Arm, Patient Position: Sitting, Cuff Size: Normal)   Pulse 83   Temp 97.9 F (36.6 C) (Temporal)   Resp 16   Ht _0  (1.549 m)   Wt 207 lb 12 oz (94.2 kg)   SpO2 99%   BMI 39.25 kg/m   Physical Exam  Constitutional: She is oriented to person, place, and time. She appears well-developed and well-nourished. No distress.  HENT:  Head: Normocephalic and atraumatic.  Eyes: Conjunctivae are normal. Pupils are equal, round, and reactive to light.  Neck: Normal range of motion. Neck supple. No thyromegaly present.  Cardiovascular: Normal rate, regular rhythm and normal heart sounds.  Pulses:      Dorsalis pedis pulses are 1+ on the right side, and 1+ on the left side.       Posterior tibial pulses are 1+ on the right side, and 1+ on the left  side.  Pulmonary/Chest: Effort normal and breath sounds normal. No respiratory distress.  Musculoskeletal:       Right foot: There is normal range of motion and no deformity.       Left foot: There is normal range of motion and  no deformity.  Feet:  Right Foot:  Protective Sensation: 6 sites tested. 6 sites sensed.  Skin Integrity: Negative for ulcer, blister, skin breakdown, erythema or warmth.  Left Foot:  Protective Sensation: 6 sites tested. 6 sites sensed.  Skin Integrity: Negative for ulcer, blister, skin breakdown, erythema or warmth.  Neurological: She is alert and oriented to person, place, and time. No cranial nerve deficit.  Skin: Skin is warm and dry. Capillary refill takes less than 2 seconds.  Psychiatric: She has a normal mood and affect. Her behavior is normal. Judgment and thought content normal.  Nursing note and vitals reviewed.    Assessment and Plan  1. HTN, goal below 130/80  Well-controlled.  Continue current medications as directed.  Continue to monitor salt intake and increase activity level as tolerated.  Food choices and weight reduction recommended. - COMPLETE METABOLIC PANEL WITH GFR  2. Type 2 diabetes mellitus without complication, without long-term current use of insulin (HCC) Hemoglobin A1c approximately 2 weeks ago did reveal A1c of approximately 13%.  This is increased from approximately 7.5% 4 months ago.  Patient is taking her metformin as directed and her Glucotrol XL.  She has an upcoming appointment with diabetic nutritionist.  She is asked to continue to work on her diet, exercise and food choices.  She is to continue to monitor her blood sugars.  She was counseled today regarding hypoglycemia and treatment for low blood sugars if those occur.  Foot exam was performed today. - Hemoglobin A1c  3. Immunization due  - Pneumococcal conjugate vaccine 13-valent-administered  4. Nausea and Dyspepsia Patient is to start Protonix as directed.   Follow-up with Dr. Oneida Alar.  Recommend possible upper endoscopy for evaluation.  I do suspect that her weight loss is secondary to her dietary changes.  Nausea could be related to her diabetic medications.  However, her nausea is improving somewhat.  She is still having persistent reflux.  Follow-up with GI as recommended.  Patient was counseled regarding any worrisome signs and symptoms of dyspepsia.  She was told if she develops abdominal pain, fever, chills, vomiting or any worrisome signs and symptoms to please contact medical help.  She voiced understanding.  No Follow-up on file. Caren Macadam, MD 11/20/2017

## 2017-11-20 NOTE — Patient Instructions (Signed)
Get your lab work done before you come back in 3 months for your visit.   Follow up with Dr. Fields/Gastroenterology  Keep scheduled appointment with nutritionist

## 2017-11-20 NOTE — Telephone Encounter (Signed)
Patient was in for office visit today and was notified

## 2017-12-05 ENCOUNTER — Encounter: Payer: BLUE CROSS/BLUE SHIELD | Attending: Family Medicine | Admitting: Nutrition

## 2017-12-05 VITALS — Wt 207.0 lb

## 2017-12-05 DIAGNOSIS — E1165 Type 2 diabetes mellitus with hyperglycemia: Secondary | ICD-10-CM | POA: Diagnosis not present

## 2017-12-05 DIAGNOSIS — IMO0002 Reserved for concepts with insufficient information to code with codable children: Secondary | ICD-10-CM

## 2017-12-05 DIAGNOSIS — Z713 Dietary counseling and surveillance: Secondary | ICD-10-CM | POA: Diagnosis not present

## 2017-12-05 DIAGNOSIS — E118 Type 2 diabetes mellitus with unspecified complications: Secondary | ICD-10-CM

## 2017-12-05 DIAGNOSIS — E669 Obesity, unspecified: Secondary | ICD-10-CM

## 2017-12-25 ENCOUNTER — Ambulatory Visit: Payer: BLUE CROSS/BLUE SHIELD | Admitting: Family Medicine

## 2018-01-07 ENCOUNTER — Encounter: Payer: Self-pay | Admitting: *Deleted

## 2018-01-07 ENCOUNTER — Telehealth: Payer: Self-pay | Admitting: *Deleted

## 2018-01-07 ENCOUNTER — Other Ambulatory Visit: Payer: Self-pay | Admitting: *Deleted

## 2018-01-07 ENCOUNTER — Ambulatory Visit: Payer: BLUE CROSS/BLUE SHIELD | Admitting: Gastroenterology

## 2018-01-07 ENCOUNTER — Encounter: Payer: Self-pay | Admitting: Gastroenterology

## 2018-01-07 VITALS — BP 119/79 | HR 69 | Temp 97.0°F | Ht 61.0 in | Wt 205.6 lb

## 2018-01-07 DIAGNOSIS — R110 Nausea: Secondary | ICD-10-CM

## 2018-01-07 DIAGNOSIS — R1013 Epigastric pain: Secondary | ICD-10-CM | POA: Diagnosis not present

## 2018-01-07 DIAGNOSIS — Z1211 Encounter for screening for malignant neoplasm of colon: Secondary | ICD-10-CM

## 2018-01-07 DIAGNOSIS — R945 Abnormal results of liver function studies: Secondary | ICD-10-CM

## 2018-01-07 DIAGNOSIS — R7989 Other specified abnormal findings of blood chemistry: Secondary | ICD-10-CM

## 2018-01-07 MED ORDER — PEG 3350-KCL-NA BICARB-NACL 420 G PO SOLR: 4000.0000 mL | Freq: Once | ORAL | 0 refills | Status: AC

## 2018-01-07 NOTE — Progress Notes (Signed)
cc'ed to pcp °

## 2018-01-07 NOTE — Progress Notes (Signed)
Primary Care Physician:  Caren Macadam, MD  Primary Gastroenterologist:  Barney Drain, MD   Chief Complaint  Patient presents with  . Nausea    Zofran helping    HPI:  Maria Fisher is a 62 y.o. female here at the request of Dr. Mannie Stabile for further evaluation of dyspepsia, nausea. Patient states she is also overdue for colonoscopy. Remote TCS over 10 years ago.   She has h/o DM, over the last six months worsening control. Hgb A1C went from 7 to over 13. Over the past 4-5 months she has had significant nausea, without vomiting. Unable to eat at times. Patient is lost nearly 20 pounds since January.  Site of and smell of food would make sick. Nauseated upon waking most daily. No heartburn or abd pain. BM regular. No melena, brbpr.   Glipizide added to her DM regimen. Also on Metformin. Started on pantoprazole and zofran back in 11/2017. Nausea has improved. Hasn't tried stopping Zofran at this point. Weight loss has stabilized.   Patient was also noted to have new elevated AST/ALT of 41/43. Total bili and AP were normal. LFTs normal in 06/2017.    Current Outpatient Medications  Medication Sig Dispense Refill  . aspirin EC 81 MG tablet Take 81 mg by mouth daily.    Marland Kitchen atorvastatin (LIPITOR) 40 MG tablet Take 40 mg by mouth daily.     . blood glucose meter kit and supplies KIT Dispense based on patient and insurance preference. Use up to four times daily as directed. (FOR ICD-9 250.00, 250.01). 1 each 0  . calcium-vitamin D 250-100 MG-UNIT tablet Take 1 tablet by mouth 2 (two) times daily.    Marland Kitchen glipiZIDE (GLUCOTROL XL) 10 MG 24 hr tablet Take 1 tablet (10 mg total) by mouth daily with breakfast. 30 tablet 1  . glucose blood (CONTOUR NEXT TEST) test strip 1 each by Other route 2 (two) times daily. Use as instructed 100 each 12  . lisinopril-hydrochlorothiazide (PRINZIDE,ZESTORETIC) 20-25 MG tablet Take 1 tablet by mouth daily. 90 tablet 1  . metFORMIN (GLUCOPHAGE) 1000 MG tablet Take 1 tablet  (1,000 mg total) by mouth 2 (two) times daily with a meal. 180 tablet 2  . ondansetron (ZOFRAN) 4 MG tablet Take 1 tablet (4 mg total) by mouth every 8 (eight) hours as needed for nausea or vomiting. 12 tablet 0  . ondansetron (ZOFRAN) 4 MG tablet Take 1 tablet (4 mg total) by mouth every 8 (eight) hours as needed for nausea. 20 tablet 0  . pantoprazole (PROTONIX) 40 MG tablet Take 1 tablet (40 mg total) by mouth daily. 30 tablet 3   No current facility-administered medications for this visit.     Allergies as of 01/07/2018  . (No Known Allergies)    Past Medical History:  Diagnosis Date  . Back pain   . Diabetes mellitus without complication ([place])   . Heart murmur   . Hyperlipidemia   . Hypertension   . Neck pain   . Neuromuscular disorder ([place])   . Shoulder pain, bilateral     Past Surgical History:  Procedure Laterality Date  . ABDOMINAL HYSTERECTOMY    . CESAREAN SECTION     x 2  . CHOLECYSTECTOMY      Family History  Problem Relation Age of Onset  . Diabetes Mother   . Heart disease Mother   . Kidney disease Mother   . Depression Mother   . Hypertension Mother   . Hyperlipidemia Mother   .  Cirrhosis Father   . Alcohol abuse Father   . Diabetes Sister   . Hypertension Sister   . Hypertension Brother   . Hypertension Daughter   . Hypertension Son   . Colon cancer Neg Hx     Social History   Socioeconomic History  . Marital status: Married    Spouse name: Not on file  . Number of children: 2  . Years of education: 55  . Highest education level: Not on file  Occupational History  . Occupation: Child care  Social Needs  . Financial resource strain: Not on file  . Food insecurity:    Worry: Not on file    Inability: Not on file  . Transportation needs:    Medical: Not on file    Non-medical: Not on file  Tobacco Use  . Smoking status: Never Smoker  . Smokeless tobacco: Never Used  Substance and Sexual Activity  . Alcohol use: No  . Drug use: No   . Sexual activity: Not Currently    Partners: Male  Lifestyle  . Physical activity:    Days per week: Not on file    Minutes per session: Not on file  . Stress: Not on file  Relationships  . Social connections:    Talks on phone: Not on file    Gets together: Not on file    Attends religious service: Not on file    Active member of club or organization: Not on file    Attends meetings of clubs or organizations: Not on file    Relationship status: Not on file  . Intimate partner violence:    Fear of current or ex partner: Not on file    Emotionally abused: Not on file    Physically abused: Not on file    Forced sexual activity: Not on file  Other Topics Concern  . Not on file  Social History Narrative   Lives at home with her husband. Married.    Right-handed.   3 cups coffee most days. Eats all food groups.    Does walking for exercise.    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    Enjoys gong to [employer] and [employer].   Has two children.       ROS:  General: Negative for anorexia,  fever, chills, fatigue, weakness. See hpi Eyes: Negative for vision changes.  ENT: Negative for hoarseness, difficulty swallowing , nasal congestion. CV: Negative for chest pain, angina, palpitations, dyspnea on exertion, peripheral edema.  Respiratory: Negative for dyspnea at rest, dyspnea on exertion, cough, sputum, wheezing.  GI: See history of present illness. GU:  Negative for dysuria, hematuria, urinary incontinence, urinary frequency, nocturnal urination.  MS: Negative for joint pain, low back pain.  Derm: Negative for rash or itching.  Neuro: Negative for weakness, abnormal sensation, seizure, frequent headaches, memory loss, confusion.  Psych: Negative for anxiety, depression, suicidal ideation, hallucinations.  Endo: Negative for unusual weight change.  Heme: Negative for bruising or bleeding. Allergy: Negative for rash or hives.    Physical Examination:  BP  119/79   Pulse 69   Temp (!) 97 F (36.1 C) (Oral)   Ht '5\' 1"'  (1.549 m)   Wt 205 lb 9.6 oz (93.3 kg)   BMI 38.85 kg/m    General: Well-nourished, well-developed in no acute distress. Accompanied by spouse Head: Normocephalic, atraumatic.   Eyes: Conjunctiva pink, no icterus. Mouth: Oropharyngeal mucosa moist and pink , no lesions erythema or exudate.  Neck: Supple without thyromegaly, masses, or lymphadenopathy.  Lungs: Clear to auscultation bilaterally.  Heart: Regular rate and rhythm, no murmurs rubs or gallops.  Abdomen: Bowel sounds are normal, nontender, nondistended, no hepatosplenomegaly or masses, no abdominal bruits or    hernia , no rebound or guarding.   Rectal: not performed Extremities: No lower extremity edema. No clubbing or deformities.  Neuro: Alert and oriented x 4 , grossly normal neurologically.  Skin: Warm and dry, no rash or jaundice.   Psych: Alert and cooperative, normal mood and affect.  Labs: Lab Results  Component Value Date   CREATININE 1.21 (H) 11/08/2017   BUN 21 11/08/2017   NA 141 11/08/2017   K 4.7 11/08/2017   CL 105 11/08/2017   CO2 28 11/08/2017   Lab Results  Component Value Date   ALT 43 (H) 11/08/2017   AST 41 (H) 11/08/2017   BILITOT 0.4 11/08/2017   Lab Results  Component Value Date   WBC 9.7 10/23/2017   HGB 13.6 10/23/2017   HCT 40.8 10/23/2017   MCV 82.4 10/23/2017   PLT 327 10/23/2017   Lab Results  Component Value Date   HGBA1C 13.1 (H) 11/08/2017   Lab Results  Component Value Date   LIPASE 76 (H) 11/08/2017   Lab Results  Component Value Date   AMYLASE 93 11/08/2017     Imaging Studies: No results found.

## 2018-01-07 NOTE — Patient Instructions (Addendum)
1. Continue pantoprazole daily this before breakfast. 2. Continue Zofran as needed for nausea. 3. Endoscopy and upper endoscopy as scheduled.  Please see separate instructions.

## 2018-01-07 NOTE — Assessment & Plan Note (Signed)
Mildly elevated AST/ALT (new since 06/2017). No abdominal pain. Possible related to fatty liver. Plans for recheck by PCP in near future. If persistently elevated, she will need further work up with further labs and abdominal u/s.

## 2018-01-07 NOTE — Telephone Encounter (Signed)
Called pt insurance [employer] and was advised no PA is required for TCS/EGD.

## 2018-01-07 NOTE — Assessment & Plan Note (Signed)
62 y/o female with several month h/o nausea, worse with foods, unable to eat at times. Nearly 25 pounds weight loss. HgbA1C went from 7 to 13 over the past 6 months. Bump in AST/ALT as well. She denies abdominal pain. She is on PPI and Zofran. Symptoms are somewhat controlled. Would offer EGD to evaluate for new onset symptoms in patient greater than age 82. EGD in near future.  I have discussed the risks, alternatives, benefits with regards to but not limited to the risk of reaction to medication, bleeding, infection, perforation and the patient is agreeable to proceed. Written consent to be obtained.  She will continue PPI, Zofran prn.

## 2018-01-07 NOTE — Assessment & Plan Note (Signed)
Patient reports overdue for colonoscopy, last one being more than 10 years ago. She would like colonoscopy at this time.  I have discussed the risks, alternatives, benefits with regards to but not limited to the risk of reaction to medication, bleeding, infection, perforation and the patient is agreeable to proceed. Written consent to be obtained.

## 2018-01-08 ENCOUNTER — Other Ambulatory Visit: Payer: Self-pay | Admitting: Family Medicine

## 2018-01-08 DIAGNOSIS — E1165 Type 2 diabetes mellitus with hyperglycemia: Secondary | ICD-10-CM

## 2018-01-10 ENCOUNTER — Telehealth: Payer: Self-pay | Admitting: Family Medicine

## 2018-01-10 ENCOUNTER — Other Ambulatory Visit: Payer: Self-pay

## 2018-01-10 DIAGNOSIS — E1165 Type 2 diabetes mellitus with hyperglycemia: Secondary | ICD-10-CM

## 2018-01-10 MED ORDER — GLIPIZIDE ER 10 MG PO TB24: 10.0000 mg | ORAL_TABLET | Freq: Every day | ORAL | 1 refills | Status: DC

## 2018-01-10 NOTE — Telephone Encounter (Signed)
Refill sent in. Call patient lvm

## 2018-01-10 NOTE — Telephone Encounter (Signed)
Request rx for glipiZIDE (GLUCOTROL XL) 10 MG 24 hr tablet [[id number]]   Please call in refill

## 2018-01-11 ENCOUNTER — Telehealth: Payer: Self-pay | Admitting: Family Medicine

## 2018-01-11 MED ORDER — GLIPIZIDE 10 MG PO TABS: 10.0000 mg | ORAL_TABLET | Freq: Two times a day (BID) | ORAL | 3 refills | Status: DC

## 2018-01-11 NOTE — Telephone Encounter (Signed)
Please advise patient that we will change her dose to the glipizide regular medication.  This is not extended release.  Therefore she will need to take the medication twice a day.  She should try to take the dosing 12 hours apart.  Therefore she should discontinue the glipizide XL.  She should start glipizide 10 mg 1 pill twice a day.  She should continue her other medications and keep her scheduled follow-up.  We have sent this medication into her pharmacy.

## 2018-01-11 NOTE — Telephone Encounter (Signed)
[facility] left voicemail  Cb#: [phone]   Glucotrol XL 10mg  not covered by insurance,  They recommend glimiperide or glipizide

## 2018-01-11 NOTE — Telephone Encounter (Signed)
lvm for patient to call back

## 2018-01-11 NOTE — Telephone Encounter (Signed)
Advised patient of prescription change with verbal understanding.

## 2018-01-14 ENCOUNTER — Telehealth: Payer: Self-pay

## 2018-01-14 NOTE — Telephone Encounter (Signed)
Spoke with patient and discussed the Glipizide with verbal understanding. Also asked her to please ask to speak with a pharmacist when she goes to pick up her prescription to verify her prescription and make sure it is the XL vs the twice a day Glipizide. I did not want her to be taking it twice daily when she should only be taking it once daily. I explained to her the insurance company wanting her to use only certain pharmacies with verbal understanding. Encouraged patient to call the office back should she have any further questions or concerns.

## 2018-01-14 NOTE — Telephone Encounter (Signed)
Jonni Sanger from [facility] called. He said the patients insurance company had a new program where they had to get their maintenance medications filled at [facility], or [facility]. They would cover two more refills at [facility]. The issue wasn't that the insurance would no longer cover the Glipizide XL, it was the pharmacy it was being filled at. He went ahead and filled the Glipizide XL and made sure the patient knew she was getting the XL. I called patient to follow up to make sure she understood not to take the Glipizide twice daily. There was no answer. Left a vm requesting a call back.

## 2018-01-16 ENCOUNTER — Encounter: Payer: Self-pay | Admitting: Nutrition

## 2018-01-16 ENCOUNTER — Ambulatory Visit: Payer: BLUE CROSS/BLUE SHIELD | Admitting: Nutrition

## 2018-01-16 NOTE — Progress Notes (Signed)
  Medical Nutrition Therapy:  Appt start time: 1100 end time:  1200.   Assessment:  Primary concerns today: Diabetes and OBesity. Here with her husband. Lab Results  Component Value Date   HGBA1C 13.1 (H) 11/08/2017  PCP: .Metformin 1000 mg BID, GLipiizide, Was in ER after not feeling well and BS was 600. Sees DR. Hagler. NOt testing blood sugars at home. Is afraid of insulin.  Preferred Learning Style:     No preference indicated   Learning Readiness:   Ready  Change in progress   MEDICATIONS:    DIETARY INTAKE:  Eats 1-2 meals per day.  Usual physical activity: Adl  Estimated energy needs: 1500  calories 170 g carbohydrates 112 g protein 42 g fat  Progress Towards Goal(s):  In progress.   Nutritional Diagnosis:  NB-1.1 Food and nutrition-related knowledge deficit As related to Diabetes and OBesity].  As evidenced by BMI > 30 and A1C 13^%.    Intervention:  Nutrition and Diabetes education provided on My Plate, CHO counting, meal planning, portion sizes, timing of meals, avoiding snacks between meals unless having a low blood sugar, target ranges for A1C and blood sugars, signs/symptoms and treatment of hyper/hypoglycemia, monitoring blood sugars, taking medications as prescribed, benefits of exercising 30 minutes per day and prevention of complications of DM. Marland Kitchen Goals Follow My Plate Cut out sodas, tea, juices,  Eat three balanced meals Cut out snacks and junk food  Drink only water Test blood sugars 4 times per day. Take medication as prescribed.   Teaching Method Utilized: Visual Auditory Hands on  Handouts given during visit include:  The Plate Method   Meal Plan Card    Barriers to learning/adherence to lifestyle change: none   Demonstrated degree of understanding via:  Teach Back   Monitoring/Evaluation:  Dietary intake, exercise, meal planning, sbg, and body weight in 1 month(s). She would benefit from long acting insulin.

## 2018-01-16 NOTE — Patient Instructions (Addendum)
Goals Follow My Plate Cut out sodas, tea, juices,  Eat three balanced meals Cut out snacks and junk food  Drink only water Test blood sugars 4 times per day. Take medication as prescribed.

## 2018-02-07 ENCOUNTER — Encounter: Payer: Self-pay | Admitting: Nutrition

## 2018-02-07 ENCOUNTER — Encounter: Payer: Self-pay | Admitting: Family Medicine

## 2018-02-07 ENCOUNTER — Encounter: Payer: BLUE CROSS/BLUE SHIELD | Attending: Family Medicine | Admitting: Nutrition

## 2018-02-07 VITALS — Ht 61.0 in | Wt 205.6 lb

## 2018-02-07 DIAGNOSIS — E669 Obesity, unspecified: Secondary | ICD-10-CM

## 2018-02-07 DIAGNOSIS — E119 Type 2 diabetes mellitus without complications: Secondary | ICD-10-CM

## 2018-02-07 NOTE — Progress Notes (Signed)
  Medical Nutrition Therapy:  Appt start time: 1030 end time:  1100  Assessment:  Primary concerns today: Diabetes and Obesity. F/u. Her husband didn't come with her. Changes made: has cut out sodas and sweet tea. Drinking more water. Eating more fresh fruits and vegetables. Meter: last week avg 93 mg/dl,14 days 95 mg/dl.30 mg/dl. This is a drastic improvement from her previous A1C of 13.1% 2 months ago. Currently on Metformin 1000 mg BID and 10 mg Glipizide BID. Had 1-2 low blood sugars in the 60's between meals. She notes she sometimes doesn't take her metformin at night due to low blood sugar in am. Physical actvity;ADL. Trying not to eat between meals. Feels much better. Making excellent progress. Sees Dr. Mannie Stabile next 2 weeks. Wants to lose weight.  Lab Results  Component Value Date   HGBA1C 13.1 (H) 11/08/2017  PCP: .Metformin 1000 mg BID, GLipiizide,   Preferred Learning Style:     No preference indicated   Learning Readiness:   Ready  Change in progress   MEDICATIONS:    DIETARY INTAKE:  B) Grils and boiled eggs 2, water or cherrios with milk,coffee tunafish bread 2 slices. L) Fish sandwich from [employer], unsweet tea D) Chicken sandwich, water, Usual physical activity: Adl  Estimated energy needs: 1500  calories 170 g carbohydrates 112 g protein 42 g fat  Progress Towards Goal(s):  In progress.   Nutritional Diagnosis:  NB-1.1 Food and nutrition-related knowledge deficit As related to Diabetes and OBesity].  As evidenced by BMI > 30 and A1C 13^%.    Intervention:  Nutrition and Diabetes education provided on My Plate, CHO counting, meal planning, portion sizes, timing of meals, avoiding snacks between meals unless having a low blood sugar, target ranges for A1C and blood sugars, signs/symptoms and treatment of hyper/hypoglycemia, monitoring blood sugars, taking medications as prescribed, benefits of exercising 30 minutes per day and prevention of complications  of DM. Marland Kitchen Goals Increase fresh vegetables to 2 svg with lunch and dinner Increase fruits 2-3 per day Keep drinking water Walk 30 minutes a day.   Teaching Method Utilized: Visual Auditory Hands on  Handouts given during visit include:  The Plate Method   Meal Plan Card    Barriers to learning/adherence to lifestyle change: none   Demonstrated degree of understanding via:  Teach Back   Monitoring/Evaluation:  Dietary intake, exercise, meal planning, sbg, and body weight in 3 month(s). Would recommend to discontinue her glipizide due to excllent blood sugars and a few low blood sugars.

## 2018-02-07 NOTE — Patient Instructions (Signed)
Goals Increase fresh vegetables to 2 svg with lunch and dinner Increase fruits 2-3 per day Keep drinking water Walk 30 minutes a day.

## 2018-02-08 ENCOUNTER — Encounter: Payer: Self-pay | Admitting: Family Medicine

## 2018-02-20 ENCOUNTER — Ambulatory Visit: Payer: BLUE CROSS/BLUE SHIELD | Admitting: Family Medicine

## 2018-03-13 ENCOUNTER — Telehealth: Payer: Self-pay | Admitting: Gastroenterology

## 2018-03-13 NOTE — Telephone Encounter (Signed)
[phone]  Please call patient, she needs to reschedule her procedure

## 2018-03-13 NOTE — Telephone Encounter (Signed)
Spoke with patient and she needs to r/s TCS scheduled for 03/18/18 with [facility]. This has been re-scheduled for 04/25/18 at 1:00pm. Aware I am mailing new prep instructions to her. Called Carolyn in Endo and LMOVM making aware of appt change.

## 2018-04-09 ENCOUNTER — Encounter: Payer: Self-pay | Admitting: Family Medicine

## 2018-04-09 ENCOUNTER — Other Ambulatory Visit: Payer: Self-pay

## 2018-04-09 ENCOUNTER — Ambulatory Visit (INDEPENDENT_AMBULATORY_CARE_PROVIDER_SITE_OTHER): Payer: BLUE CROSS/BLUE SHIELD | Admitting: Family Medicine

## 2018-04-09 VITALS — BP 108/58 | HR 71 | Temp 98.4°F | Resp 16 | Ht 61.0 in | Wt 200.0 lb

## 2018-04-09 DIAGNOSIS — Z23 Encounter for immunization: Secondary | ICD-10-CM

## 2018-04-09 DIAGNOSIS — E1165 Type 2 diabetes mellitus with hyperglycemia: Secondary | ICD-10-CM | POA: Diagnosis not present

## 2018-04-09 DIAGNOSIS — I10 Essential (primary) hypertension: Secondary | ICD-10-CM

## 2018-04-09 DIAGNOSIS — E782 Mixed hyperlipidemia: Secondary | ICD-10-CM

## 2018-04-09 DIAGNOSIS — Z79899 Other long term (current) drug therapy: Secondary | ICD-10-CM

## 2018-04-09 DIAGNOSIS — R946 Abnormal results of thyroid function studies: Secondary | ICD-10-CM

## 2018-04-09 MED ORDER — LISINOPRIL-HYDROCHLOROTHIAZIDE 10-12.5 MG PO TABS: 1.0000 | ORAL_TABLET | Freq: Every day | ORAL | 3 refills | Status: DC

## 2018-04-09 NOTE — Patient Instructions (Signed)
Start the lisinopril/HCTZ at 10/12.5 mg a day. Stop the other lisinopril/HCTZ 20/25 mg a day Get this new one at the pharmacy.  Do not pick up your glipizide at the pharmacy until you hear back from me about your A1C and whether we need to decrease your dose.   You need your flu shot in October.

## 2018-04-09 NOTE — Progress Notes (Signed)
Patient ID: Maria Fisher, female    DOB: 1955/12/28, 62 y.o.   MRN: [id number]  Chief Complaint  Patient presents with  . Diabetes    follow up    Allergies Patient has no known allergies.  Subjective:   Maria Fisher is a 62 y.o. female who presents to [facility] today.  HPI Patient is here for follow-up.  Reports she is doing well.  Energy level is good.  Mood is good.  Has not been having the nausea.  Denies any hypoglycemic episodes.  Denies any chest pain, shortness of breath, or swelling in her extremities.  Is not having any side effects with her medication.  Denies any orthostasis or dizzy symptoms.  Does report that blood pressures been running lower since she has lost weight.  Is still making dietary changes and trying to watch her salt and sugar intake.  Is getting some exercise.  Denies any lesions on her feet.  Has upcoming appointment for colonoscopy scheduled at the end of August.  Denies any difficulty swallowing or pain in her neck.  No swelling in her extremities.  Denies any myalgias or muscle cramps.  No side effects with medication per patient report.  Diabetes  She presents for her follow-up diabetic visit. She has type 2 diabetes mellitus. No MedicAlert identification noted. Her disease course has been improving. There are no hypoglycemic associated symptoms. There are no diabetic associated symptoms. Symptoms are improving. There are no diabetic complications. Pertinent negatives for diabetic complications include no CVA. Risk factors for coronary artery disease include diabetes mellitus, dyslipidemia, family history, obesity, hypertension and post-menopausal. Current diabetic treatment includes oral agent (monotherapy). She is compliant with treatment most of the time. Her weight is decreasing steadily. She is following a diabetic, generally healthy, high fiber and low salt diet. She has had a previous visit with a dietitian. She participates in  exercise intermittently. Her home blood glucose trend is decreasing steadily. Her overall blood glucose range is 110-130 mg/dl. An ACE inhibitor/angiotensin II receptor blocker is being taken. She does not see a podiatrist.Eye exam is current.    Past Medical History:  Diagnosis Date  . Back pain   . Diabetes mellitus without complication ([place])   . Heart murmur   . Hyperlipidemia   . Hypertension   . Neck pain   . Neuromuscular disorder ([place])   . Shoulder pain, bilateral   . Thyroid nodule     Past Surgical History:  Procedure Laterality Date  . ABDOMINAL HYSTERECTOMY    . CESAREAN SECTION     x 2  . CHOLECYSTECTOMY      Family History  Problem Relation Age of Onset  . Diabetes Mother   . Heart disease Mother   . Kidney disease Mother   . Depression Mother   . Hypertension Mother   . Hyperlipidemia Mother   . Cirrhosis Father   . Alcohol abuse Father   . Diabetes Sister   . Hypertension Sister   . Hypertension Brother   . Hypertension Daughter   . Hypertension Son   . Colon cancer Neg Hx      Social History   Socioeconomic History  . Marital status: Married    Spouse name: Not on file  . Number of children: 2  . Years of education: 40  . Highest education level: Not on file  Occupational History  . Occupation: Child care  Social Needs  . Financial resource strain: Not on file  .  Food insecurity:    Worry: Not on file    Inability: Not on file  . Transportation needs:    Medical: Not on file    Non-medical: Not on file  Tobacco Use  . Smoking status: Never Smoker  . Smokeless tobacco: Never Used  Substance and Sexual Activity  . Alcohol use: No  . Drug use: No  . Sexual activity: Not Currently    Partners: Male  Lifestyle  . Physical activity:    Days per week: Not on file    Minutes per session: Not on file  . Stress: Not on file  Relationships  . Social connections:    Talks on phone: Not on file    Gets together: Not on file    Attends  religious service: Not on file    Active member of club or organization: Not on file    Attends meetings of clubs or organizations: Not on file    Relationship status: Not on file  Other Topics Concern  . Not on file  Social History Narrative   Lives at home with her husband. Married.    Right-handed.   3 cups coffee most days. Eats all food groups.    Does walking for exercise.    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    Enjoys gong to [employer] and [employer].   Has two children.    Current Outpatient Medications on File Prior to Visit  Medication Sig Dispense Refill  . aspirin EC 81 MG tablet Take 81 mg by mouth daily.    Marland Kitchen atorvastatin (LIPITOR) 40 MG tablet Take 40 mg by mouth daily.     . blood glucose meter kit and supplies KIT Dispense based on patient and insurance preference. Use up to four times daily as directed. (FOR ICD-9 250.00, 250.01). 1 each 0  . calcium-vitamin D 250-100 MG-UNIT tablet Take 1 tablet by mouth 2 (two) times daily.    Marland Kitchen glipiZIDE (GLUCOTROL) 10 MG tablet Take 1 tablet (10 mg total) by mouth 2 (two) times daily before a meal. 60 tablet 3  . glucose blood (CONTOUR NEXT TEST) test strip 1 each by Other route 2 (two) times daily. Use as instructed 100 each 12  . metFORMIN (GLUCOPHAGE) 1000 MG tablet Take 1 tablet (1,000 mg total) by mouth 2 (two) times daily with a meal. 180 tablet 2  . ondansetron (ZOFRAN) 4 MG tablet Take 1 tablet (4 mg total) by mouth every 8 (eight) hours as needed for nausea or vomiting. 12 tablet 0  . pantoprazole (PROTONIX) 40 MG tablet Take 1 tablet (40 mg total) by mouth daily. 30 tablet 3   No current facility-administered medications on file prior to visit.     Review of Systems   Objective:   BP (!) 108/58 (BP Location: Right Arm, Patient Position: Sitting, Cuff Size: Large)   Pulse 71   Temp 98.4 F (36.9 C) (Temporal)   Resp 16   Ht '5\' 1"'  (1.549 m)   Wt 200 lb (90.7 kg)   SpO2 98% Comment: room air  BMI  37.79 kg/m   Physical Exam  Constitutional: She is oriented to person, place, and time. She appears well-developed and well-nourished.  HENT:  Head: Normocephalic and atraumatic.  Eyes: Pupils are equal, round, and reactive to light. Conjunctivae and EOM are normal. No scleral icterus.  Neck: Normal range of motion. Neck supple. No JVD present.  Cardiovascular: Normal rate, regular rhythm and normal heart sounds.  Pulmonary/Chest: Effort normal and breath sounds normal.  Abdominal: Soft. Bowel sounds are normal. She exhibits no distension.  Neurological: She is alert and oriented to person, place, and time.  Skin: Skin is warm and dry. Capillary refill takes less than 2 seconds.  Psychiatric: She has a normal mood and affect. Her behavior is normal. Judgment and thought content normal.  Vitals reviewed.    Assessment and Plan  1. HTN, goal below 130/80 BP improved with weight loss and diet control. Decrease the lisinopril dose.  Follow up in one month for blood pressure recheck.  Will check BMP at that time. - lisinopril-hydrochlorothiazide (PRINZIDE,ZESTORETIC) 10-12.5 MG tablet; Take 1 tablet by mouth daily.  Dispense: 90 tablet; Refill: 3  2. Type 2 diabetes mellitus with hyperglycemia, without long-term current use of insulin (HCC) Check labs.  - Hemoglobin A1c - Pneumococcal polysaccharide vaccine 23-valent greater than or equal to 2yo subcutaneous/IM -Continue visits with nutritionist. Lifestyle modifications discussed with patient including a diet emphasizing vegetables, fruits, and whole grains. Limiting intake of sodium to less than 2,400 mg per day.  Recommendations discussed include consuming low-fat dairy products, poultry, fish, legumes, non-tropical vegetable oils, and nuts; and limiting intake of sweets, sugar-sweetened beverages, and red meat. Discussed following a plan such as the Dietary Approaches to Stop Hypertension (DASH) diet. Patient to read up on this diet.    3. High risk medication use Check due to statin.  - Hepatic function panel  4. Immunization due Patient needs pneumonia that visit.  Reports as to go today take child to an appointment.  We will also give flu vaccination at that time.  5. Mixed hyperlipidemia Stable.  Continue dietary modifications. LDL goal less than 70.  Hyperlipidemia and the associated risk of ASCVD were discussed today. Primary vs. Secondary prevention of ASCVD were discussed and how it relates to patient morbidity, mortality, and quality of life. Shared decision making with patient including the risks of statins vs.benefits of ASCVD risk reduction discussed.  Risks of stains discussed including myopathy, rhabdomyoloysis, liver problems, increased risk of diabetes discussed. We discussed heart healthy diet, lifestyle modifications, risk factor modifications, and adherence to the recommended treatment plan. We discussed the need to periodically monitor lipid panel and liver function tests while on statin therapy.   6. Borderline abnormal thyroid function test Monitor thyroid function. - Thyroid Panel With TSH  Keep scheduled appointment for colonoscopy. Return in about 6 weeks (around 05/21/2018). Caren Macadam, MD 04/11/2018

## 2018-04-10 DIAGNOSIS — R946 Abnormal results of thyroid function studies: Secondary | ICD-10-CM | POA: Diagnosis not present

## 2018-04-10 DIAGNOSIS — Z79899 Other long term (current) drug therapy: Secondary | ICD-10-CM | POA: Diagnosis not present

## 2018-04-10 DIAGNOSIS — E1165 Type 2 diabetes mellitus with hyperglycemia: Secondary | ICD-10-CM | POA: Diagnosis not present

## 2018-04-11 ENCOUNTER — Encounter: Payer: Self-pay | Admitting: Family Medicine

## 2018-04-11 LAB — HEPATIC FUNCTION PANEL
AG Ratio: 1 (calc) (ref 1.0–2.5)
ALKALINE PHOSPHATASE (APISO): 77 U/L (ref 33–130)
ALT: 39 U/L — ABNORMAL HIGH (ref 6–29)
AST: 28 U/L (ref 10–35)
Albumin: 4 g/dL (ref 3.6–5.1)
BILIRUBIN DIRECT: 0.1 mg/dL (ref 0.0–0.2)
Globulin: 4 g/dL (calc) — ABNORMAL HIGH (ref 1.9–3.7)
Indirect Bilirubin: 0.4 mg/dL (calc) (ref 0.2–1.2)
TOTAL PROTEIN: 8 g/dL (ref 6.1–8.1)
Total Bilirubin: 0.5 mg/dL (ref 0.2–1.2)

## 2018-04-11 LAB — THYROID PANEL WITH TSH
Free Thyroxine Index: 2.7 (ref 1.4–3.8)
T3 UPTAKE: 32 % (ref 22–35)
T4 TOTAL: 8.5 ug/dL (ref 5.1–11.9)
TSH: 0.05 mIU/L — ABNORMAL LOW (ref 0.40–4.50)

## 2018-04-11 LAB — HEMOGLOBIN A1C
EAG (MMOL/L): 8.5 (calc)
Hgb A1c MFr Bld: 7 % of total Hgb — ABNORMAL HIGH (ref <5.7)
MEAN PLASMA GLUCOSE: 154 (calc)

## 2018-04-12 ENCOUNTER — Encounter: Payer: Self-pay | Admitting: Family Medicine

## 2018-04-12 ENCOUNTER — Telehealth: Payer: Self-pay | Admitting: Family Medicine

## 2018-04-12 MED ORDER — GLIPIZIDE ER 5 MG PO TB24: 5.0000 mg | ORAL_TABLET | Freq: Every day | ORAL | 0 refills | Status: DC

## 2018-04-12 NOTE — Telephone Encounter (Signed)
Called patient to discuss lab results. No answer no vm. Will try again later.

## 2018-04-12 NOTE — Telephone Encounter (Signed)
Please call patient and advised that her hemoglobin A1c is much improved.  Her current hemoglobin A1c is 7%.  This is a decrease from over 13%.  I am so proud of all of her hard work.  I am going to decrease her glipizide.  She will now be on the glipizide XL 5 mg.  She should take 1 a day.  Continue her metformin and all of her other medications.  I have sent a letter to her in the mail with all of her labs.  Tell her to please continue her diet and keep exercising.  Great job.

## 2018-04-16 NOTE — Telephone Encounter (Signed)
Spoke with patient and advised of recent lab results with verbal understanding.

## 2018-04-19 ENCOUNTER — Telehealth: Payer: Self-pay

## 2018-04-19 NOTE — Telephone Encounter (Signed)
Pt called- she just had surgery on her gums and she doesn't think she will be able to have her tcs/egd done on 04/25/18 due to the pain and is asking to reschedule. I have moved her to 06/24/18 at 12:00. Pt agreeable to date and time. I called Hoyle Sauer- LM to move pts appt  I have done new instructions and mailed them to the pt.  Routing to MS for FYI.

## 2018-04-22 NOTE — Telephone Encounter (Signed)
Noted! Thank you

## 2018-04-23 ENCOUNTER — Ambulatory Visit (INDEPENDENT_AMBULATORY_CARE_PROVIDER_SITE_OTHER): Payer: BLUE CROSS/BLUE SHIELD | Admitting: Family Medicine

## 2018-04-23 ENCOUNTER — Encounter: Payer: Self-pay | Admitting: Family Medicine

## 2018-04-23 ENCOUNTER — Other Ambulatory Visit: Payer: Self-pay

## 2018-04-23 VITALS — BP 118/78 | HR 86 | Temp 98.8°F | Resp 14 | Ht 61.0 in | Wt 200.4 lb

## 2018-04-23 DIAGNOSIS — K0889 Other specified disorders of teeth and supporting structures: Secondary | ICD-10-CM | POA: Diagnosis not present

## 2018-04-23 NOTE — Progress Notes (Signed)
Patient ID: Maria Fisher, female    DOB: June 11, 1956, 62 y.o.   MRN: [id number]  Chief Complaint  Patient presents with  . Nausea    started after she had tooth pulled.   . Emesis    Allergies Patient has no known allergies.  Subjective:   Maria Fisher is a 62 y.o. female who presents to [facility] today.  HPI Here b/c has not been feeling well since had tooth pulled about 5 days ago. Was seen by her dentist 5 days ago and he performed some surgery in the office and pulled the root out. The tooth had already fallen out.  Was placed on antibiotics the week prior and was told that there was an abscess in her tooth/gums. 5 days ago at the time of the extraction was put on antibiotics, pain medications, and anti-inflammatory medications.   Since had the root pulled out 5 days ago, has been feeling bad. Reports that when she takes the pain medication it makes her feel nauseated and vomit. It does help with the pain. Has not called the dentist. Does not vomit unless takes the pain medication/hydrocodone.  Is taking amoxicillin 500mg , one pill four times a day. The pain medication that she is taking is hydrocodone/tylenol 5/325mg , 1 po every six hours.  Denies any chest pain, shortness of breath, vertigo, swelling in her extremities, palpitations.  She has not passed out.  She is urinating well.  Has been able to eat and drink.  Does report pain is at the site of the extraction.  Has not had any swelling in her face.  Is able to swallow okay.  Has no skin rashes.  Bowel movements are normal.  Has not vomited today.  Denies any abdominal pain.  Only has swelling over the surgical site.  No drooling.  Voice is normal.   Past Medical History:  Diagnosis Date  . Back pain   . Diabetes mellitus without complication ([place])   . Heart murmur   . Hyperlipidemia   . Hypertension   . Neck pain   . Neuromuscular disorder ([place])   . Shoulder pain, bilateral   . Thyroid nodule      Past Surgical History:  Procedure Laterality Date  . ABDOMINAL HYSTERECTOMY    . CESAREAN SECTION     x 2  . CHOLECYSTECTOMY      Family History  Problem Relation Age of Onset  . Diabetes Mother   . Heart disease Mother   . Kidney disease Mother   . Depression Mother   . Hypertension Mother   . Hyperlipidemia Mother   . Cirrhosis Father   . Alcohol abuse Father   . Diabetes Sister   . Hypertension Sister   . Hypertension Brother   . Hypertension Daughter   . Hypertension Son   . Colon cancer Neg Hx      Social History   Socioeconomic History  . Marital status: Married    Spouse name: Not on file  . Number of children: 2  . Years of education: 19  . Highest education level: Not on file  Occupational History  . Occupation: Child care  Social Needs  . Financial resource strain: Not on file  . Food insecurity:    Worry: Not on file    Inability: Not on file  . Transportation needs:    Medical: Not on file    Non-medical: Not on file  Tobacco Use  . Smoking status: Never  Smoker  . Smokeless tobacco: Never Used  Substance and Sexual Activity  . Alcohol use: No  . Drug use: No  . Sexual activity: Not Currently    Partners: Male  Lifestyle  . Physical activity:    Days per week: Not on file    Minutes per session: Not on file  . Stress: Not on file  Relationships  . Social connections:    Talks on phone: Not on file    Gets together: Not on file    Attends religious service: Not on file    Active member of club or organization: Not on file    Attends meetings of clubs or organizations: Not on file    Relationship status: Not on file  Other Topics Concern  . Not on file  Social History Narrative   Lives at home with her husband. Married.    Right-handed.   3 cups coffee most days. Eats all food groups.    Does walking for exercise.    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    Enjoys gong to [employer] and [employer].   Has two children.      Review of Systems  Constitutional: Positive for appetite change and diaphoresis. Negative for chills, fever and unexpected weight change.  HENT: Positive for dental problem. Negative for congestion, drooling, nosebleeds, rhinorrhea, sinus pressure, sinus pain, trouble swallowing and voice change.   Respiratory: Negative for cough, choking, chest tightness, shortness of breath and wheezing.   Cardiovascular: Negative for chest pain and leg swelling.  Gastrointestinal: Positive for nausea and vomiting.  Skin: Negative for rash.  Neurological: Negative for dizziness, facial asymmetry, numbness and headaches.  Psychiatric/Behavioral: Negative for dysphoric mood.     Objective:   BP 118/78 (BP Location: Left Arm, Patient Position: Sitting, Cuff Size: Large)   Pulse 86   Temp 98.8 F (37.1 C) (Oral)   Resp 14   Ht 5\' 1"  (1.549 m)   Wt 200 lb 6.4 oz (90.9 kg)   SpO2 96% Comment: room air  BMI 37.87 kg/m   Physical Exam  Constitutional: She is oriented to person, place, and time. She appears well-developed and well-nourished. No distress.  HENT:  Head: Normocephalic and atraumatic.  Mouth/Throat: Uvula is midline, oropharynx is clear and moist and mucous membranes are normal. She does not have dentures. Oral lesions present. No dental caries.  Patient with left upper teeth, extraction site visualized.  Mild associated erythema and edema.  No draining.  Patient tender to palpation with the area.  No other edema in oral cavity.  Eyes: Pupils are equal, round, and reactive to light.  Neck: Normal range of motion. Neck supple. No thyromegaly present.  Cardiovascular: Normal rate, regular rhythm and normal heart sounds.  Pulmonary/Chest: Effort normal and breath sounds normal. No respiratory distress.  Neurological: She is alert and oriented to person, place, and time. No cranial nerve deficit.  Skin: Skin is warm and dry.  Nursing note and vitals reviewed.    Assessment and Plan   1. Pain, dental Patient with nausea and vomiting, directly associated with administration of hydrocodone.  She was told to discontinue this medication. Called and spoke with dental office/Dr. Hameed. Discussed that I suspect patient is having nausea and vomiting secondary to pain medication.  However, I do believe that patient should still not be in this degree of pain 5 days after the extraction.  They were in agreement and voiced that she could have a dry socket.  She will continue antibiotics.  They will call her and work her and for a visit this afternoon.  Phone numbers were verified.  I spoke with Lovey Newcomer at the office. Patient was reassured.  We discussed that she is most likely having a medication side effect in addition to persistent dental issues.  She agreed to discontinue the medication.  She was told to call with any questions, concerns, and keep her visit with the dentist today.  She was told if her symptoms worsen or if anything else occurs she could go to the emergency department if needed.  No follow-ups on file. Caren Macadam, MD 04/23/2018

## 2018-05-14 ENCOUNTER — Ambulatory Visit: Payer: BLUE CROSS/BLUE SHIELD | Admitting: Nutrition

## 2018-05-21 ENCOUNTER — Ambulatory Visit: Payer: BLUE CROSS/BLUE SHIELD | Admitting: Family Medicine

## 2018-05-27 ENCOUNTER — Encounter: Payer: Self-pay | Admitting: Family Medicine

## 2018-05-27 ENCOUNTER — Other Ambulatory Visit: Payer: Self-pay

## 2018-05-27 ENCOUNTER — Ambulatory Visit (INDEPENDENT_AMBULATORY_CARE_PROVIDER_SITE_OTHER): Payer: BLUE CROSS/BLUE SHIELD | Admitting: Family Medicine

## 2018-05-27 VITALS — BP 138/80 | HR 71 | Temp 98.2°F | Resp 12 | Ht 61.0 in | Wt 196.0 lb

## 2018-05-27 DIAGNOSIS — Z23 Encounter for immunization: Secondary | ICD-10-CM

## 2018-05-27 DIAGNOSIS — R1013 Epigastric pain: Secondary | ICD-10-CM

## 2018-05-27 DIAGNOSIS — E119 Type 2 diabetes mellitus without complications: Secondary | ICD-10-CM

## 2018-05-27 DIAGNOSIS — I10 Essential (primary) hypertension: Secondary | ICD-10-CM | POA: Diagnosis not present

## 2018-05-27 MED ORDER — METFORMIN HCL 1000 MG PO TABS: 1000.0000 mg | ORAL_TABLET | Freq: Two times a day (BID) | ORAL | 0 refills | Status: AC

## 2018-05-27 MED ORDER — PANTOPRAZOLE SODIUM 40 MG PO TBEC: 40.0000 mg | DELAYED_RELEASE_TABLET | Freq: Every day | ORAL | 3 refills | Status: DC

## 2018-05-27 MED ORDER — GLUCOSE BLOOD VI STRP: 1.0000 | ORAL_STRIP | Freq: Two times a day (BID) | 3 refills | Status: AC

## 2018-05-27 MED ORDER — BLOOD GLUCOSE MONITOR KIT: PACK | 0 refills | Status: AC

## 2018-05-27 MED ORDER — GLIPIZIDE ER 5 MG PO TB24: 5.0000 mg | ORAL_TABLET | Freq: Every day | ORAL | 1 refills | Status: DC

## 2018-05-27 NOTE — Progress Notes (Signed)
Patient ID: Maria Fisher, female    DOB: 06/19/1956, 62 y.o.   MRN: [id number]  Chief Complaint  Patient presents with  . Diabetes    follow up    Allergies Patient has no known allergies.  Subjective:   Maria Fisher is a 62 y.o. female who presents to [facility] today.  HPI [name] presents for follow-up visit today.  She needs to get a flu shot and her pneumonia 23 vaccination.  She reports that she has had some trouble getting the glipizide from the pharmacy.  Her hemoglobin A1c was checked at the last visit and it was within normal limits.  Therefore, we have decreased the glipizide XL to 5 mg a day.  In addition, at the last visit her blood pressure was over controlled.  This was thought secondary to her weight loss and improvement in her dietary patterns. She reports that she is felt very well with the changes.  Her energy is improved.  She denies any chest pain, shortness of breath, or swelling in her extremities.  She has been very pleased with her continued weight loss.  She reports that her overall state of health is much better and she feels much better than she did a year ago.  She is still keeping follow-up visits with the nutritionist.   Past Medical History:  Diagnosis Date  . Back pain   . Diabetes mellitus without complication ([place])   . Heart murmur   . Hyperlipidemia   . Hypertension   . Neck pain   . Neuromuscular disorder ([place])   . Shoulder pain, bilateral   . Thyroid nodule     Past Surgical History:  Procedure Laterality Date  . ABDOMINAL HYSTERECTOMY    . CESAREAN SECTION     x 2  . CHOLECYSTECTOMY      Family History  Problem Relation Age of Onset  . Diabetes Mother   . Heart disease Mother   . Kidney disease Mother   . Depression Mother   . Hypertension Mother   . Hyperlipidemia Mother   . Cirrhosis Father   . Alcohol abuse Father   . Diabetes Sister   . Hypertension Sister   . Hypertension Brother   . Hypertension  Daughter   . Hypertension Son   . Colon cancer Neg Hx      Social History   Socioeconomic History  . Marital status: Married    Spouse name: Not on file  . Number of children: 2  . Years of education: 28  . Highest education level: Not on file  Occupational History  . Occupation: Child care  Social Needs  . Financial resource strain: Not on file  . Food insecurity:    Worry: Not on file    Inability: Not on file  . Transportation needs:    Medical: Not on file    Non-medical: Not on file  Tobacco Use  . Smoking status: Never Smoker  . Smokeless tobacco: Never Used  Substance and Sexual Activity  . Alcohol use: No  . Drug use: No  . Sexual activity: Not Currently    Partners: Male  Lifestyle  . Physical activity:    Days per week: Not on file    Minutes per session: Not on file  . Stress: Not on file  Relationships  . Social connections:    Talks on phone: Not on file    Gets together: Not on file    Attends  religious service: Not on file    Active member of club or organization: Not on file    Attends meetings of clubs or organizations: Not on file    Relationship status: Not on file  Other Topics Concern  . Not on file  Social History Narrative   Lives at home with her husband. Married.    Right-handed.   3 cups coffee most days. Eats all food groups.    Does walking for exercise.    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    Enjoys gong to [employer] and [employer].   Has two children.    Current Outpatient Medications on File Prior to Visit  Medication Sig Dispense Refill  . aspirin EC 81 MG tablet Take 81 mg by mouth daily.    Marland Kitchen atorvastatin (LIPITOR) 40 MG tablet Take 40 mg by mouth daily.     . calcium-vitamin D 250-100 MG-UNIT tablet Take 1 tablet by mouth 2 (two) times daily.    Marland Kitchen ibuprofen (ADVIL,MOTRIN) 800 MG tablet Take 800 mg by mouth every 8 (eight) hours as needed (pain).    Marland Kitchen lisinopril-hydrochlorothiazide (PRINZIDE,ZESTORETIC)  10-12.5 MG tablet Take 1 tablet by mouth daily. 90 tablet 3   No current facility-administered medications on file prior to visit.     Review of Systems  Constitutional: Negative for activity change, appetite change and fever.  Eyes: Negative for visual disturbance.  Respiratory: Negative for cough, chest tightness and shortness of breath.   Cardiovascular: Negative for chest pain, palpitations and leg swelling.  Gastrointestinal: Negative for abdominal pain, nausea and vomiting.  Genitourinary: Negative for dysuria, frequency and urgency.  Skin: Negative for rash.  Neurological: Negative for dizziness, syncope and light-headedness.  Hematological: Negative for adenopathy.  Psychiatric/Behavioral: Negative for decreased concentration and dysphoric mood. The patient is not nervous/anxious.      Objective:   BP 138/80 (BP Location: Right Arm, Patient Position: Sitting, Cuff Size: Large)   Pulse 71   Temp 98.2 F (36.8 C) (Temporal)   Resp 12   Ht '5\' 1"'  (1.549 m)   Wt 196 lb (88.9 kg)   SpO2 98% Comment: room air  BMI 37.03 kg/m   Physical Exam  Constitutional: She is oriented to person, place, and time. She appears well-developed and well-nourished. No distress.  HENT:  Mouth/Throat: Oropharynx is clear and moist.  Eyes: Pupils are equal, round, and reactive to light. Conjunctivae are normal.  Neck: Normal range of motion. Neck supple. No tracheal deviation present. No thyromegaly present.  Cardiovascular: Normal rate and regular rhythm.  Pulmonary/Chest: Effort normal and breath sounds normal.  Abdominal: Soft.  Neurological: She is alert and oriented to person, place, and time. No cranial nerve deficit.  Skin: Skin is warm. No rash noted. She is not diaphoretic.  Psychiatric: She has a normal mood and affect. Her behavior is normal. Thought content normal.  Vitals reviewed.    Assessment and Plan   1. HTN, goal below 130/80 Blood pressure currently at goal.  Stay at  decreased dose of blood pressure medication.  Refill sent to pharmacy.  Blood pressure goals discussed with patient.  2. Type 2 diabetes mellitus without complication, without long-term current use of insulin ([place]) -Continue medications as directed.  Continue with diet, exercise, and weight loss modifications.  Foot care discussed today.  Patient was congratulated on her decreased blood sugars and lifestyle modifications.  Patient was encouraged to continue with the treatment recommendations. - metFORMIN (GLUCOPHAGE) 1000 MG tablet;  Take 1 tablet (1,000 mg total) by mouth 2 (two) times daily with a meal.  Dispense: 180 tablet; Refill: 0 - glucose blood (CONTOUR NEXT TEST) test strip; 1 each by Other route 2 (two) times daily. Use as instructed  Dispense: 100 each; Refill: 3 - glipiZIDE (GLIPIZIDE XL) 5 MG 24 hr tablet; Take 1 tablet (5 mg total) by mouth daily with breakfast.  Dispense: 90 tablet; Refill: 1 - Pneumococcal polysaccharide vaccine 23-valent greater than or equal to 2yo subcutaneous/IM - blood glucose meter kit and supplies KIT; Dispense based on patient and insurance preference.lancets to fit device.  Dispense: 1 each; Refill: 0  3. Dyspepsia Refill medication.  Reflux precautions.  Keep scheduled visits with GI as directed. - pantoprazole (PROTONIX) 40 MG tablet; Take 1 tablet (40 mg total) by mouth daily.  Dispense: 30 tablet; Refill: 3  4. Need for immunization against influenza - Flu Vaccine QUAD 36+ mos IM  No follow-ups on file. Caren Macadam, MD 05/27/2018

## 2018-06-06 IMAGING — MG DIGITAL SCREENING BILATERAL MAMMOGRAM WITH CAD
8 series · 8 of 8 positions shown · non-contrast
Comparison: Previous exam(s).

CLINICAL DATA: Screening.

EXAM:
DIGITAL SCREENING BILATERAL MAMMOGRAM WITH CAD

[L MLO (1 of 2)]
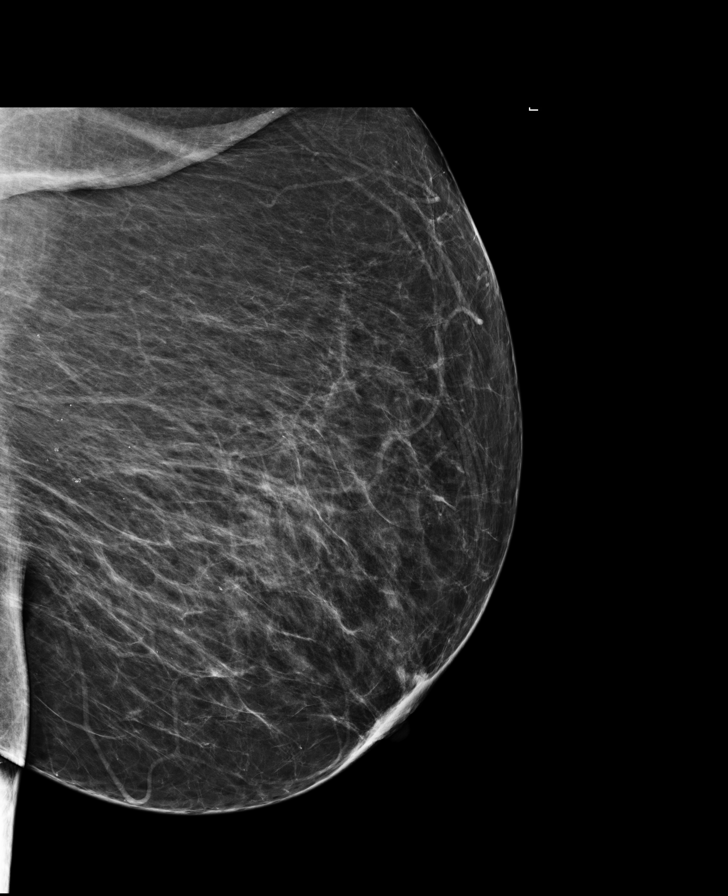

[L CC (1 of 2)]
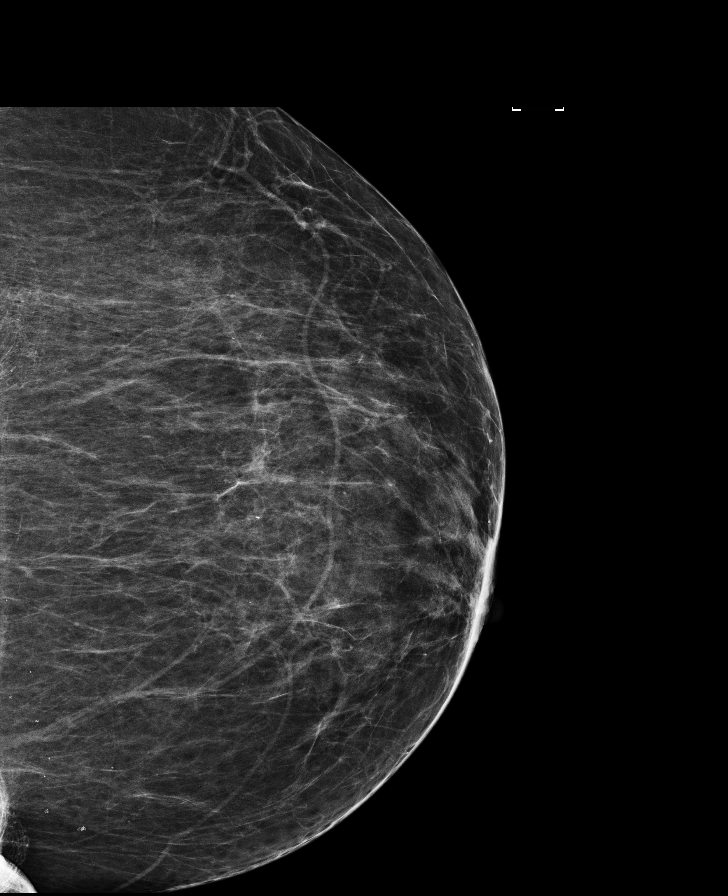

[R MLO (1 of 3)]
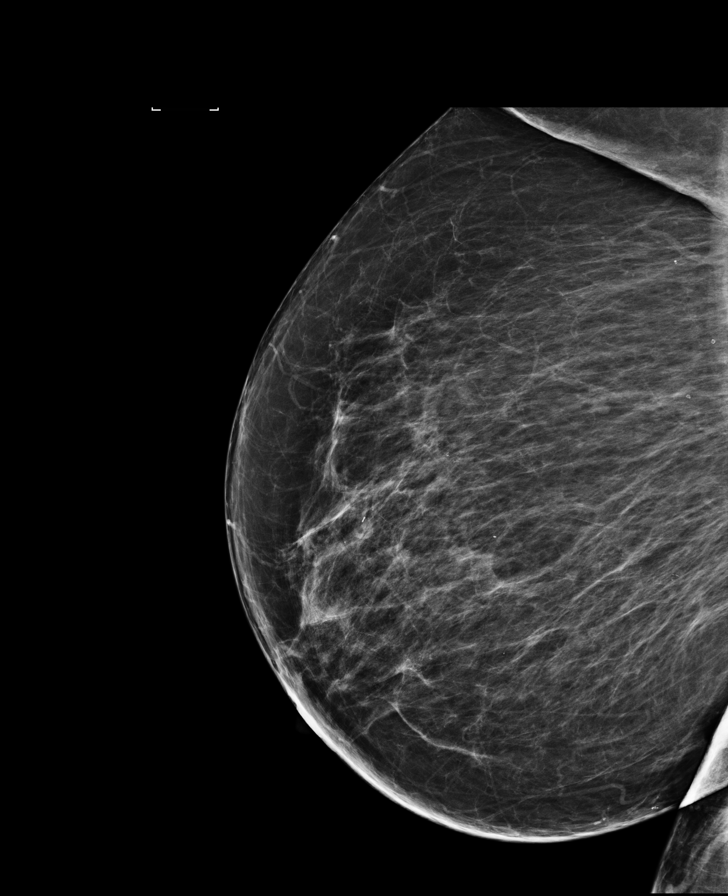

[R MLO (2 of 3)]
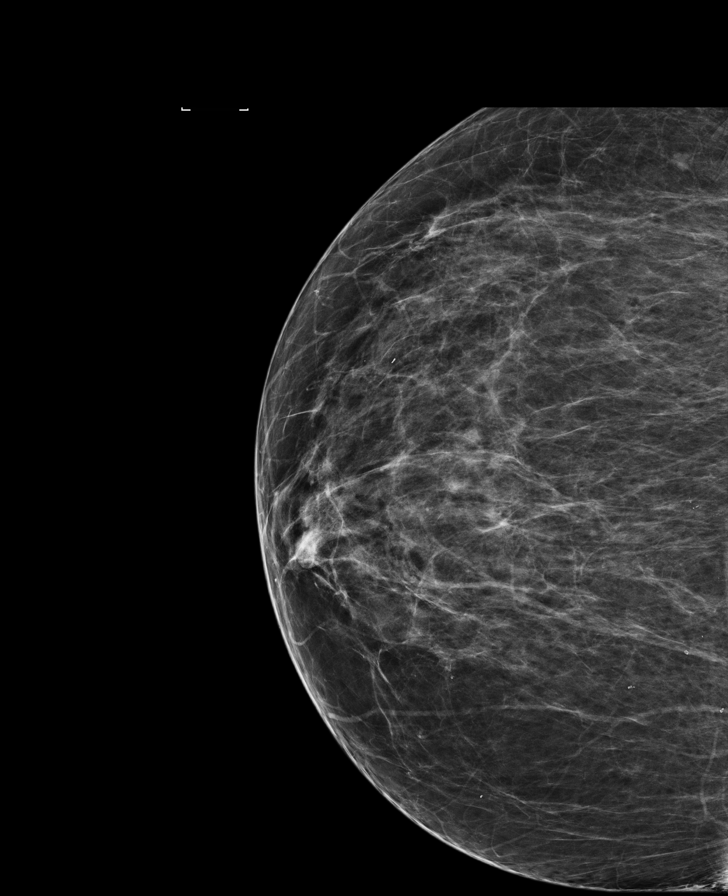

[L CC (2 of 2)]
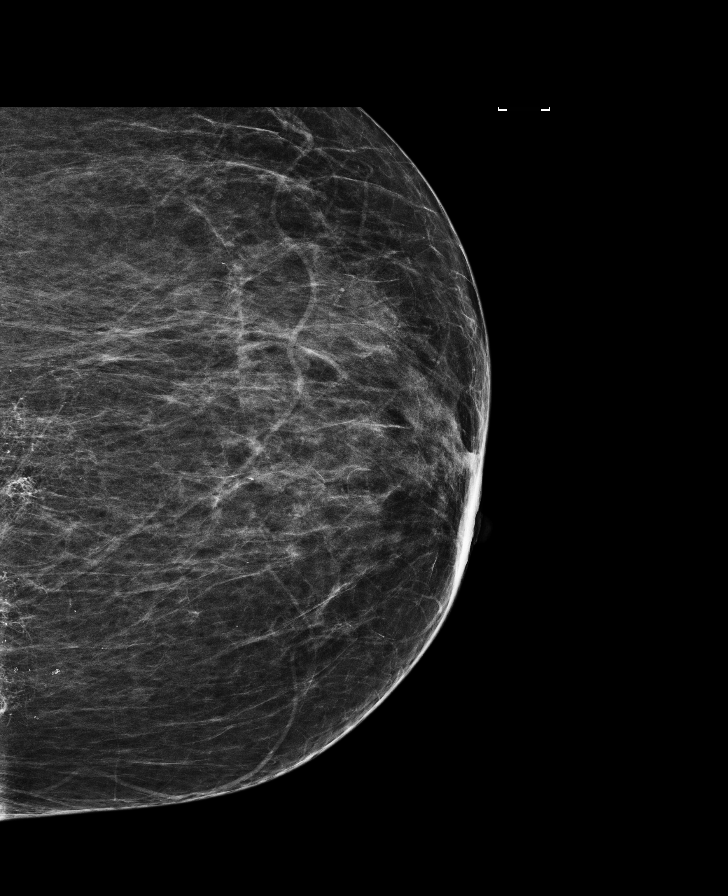

[L MLO (2 of 2)]
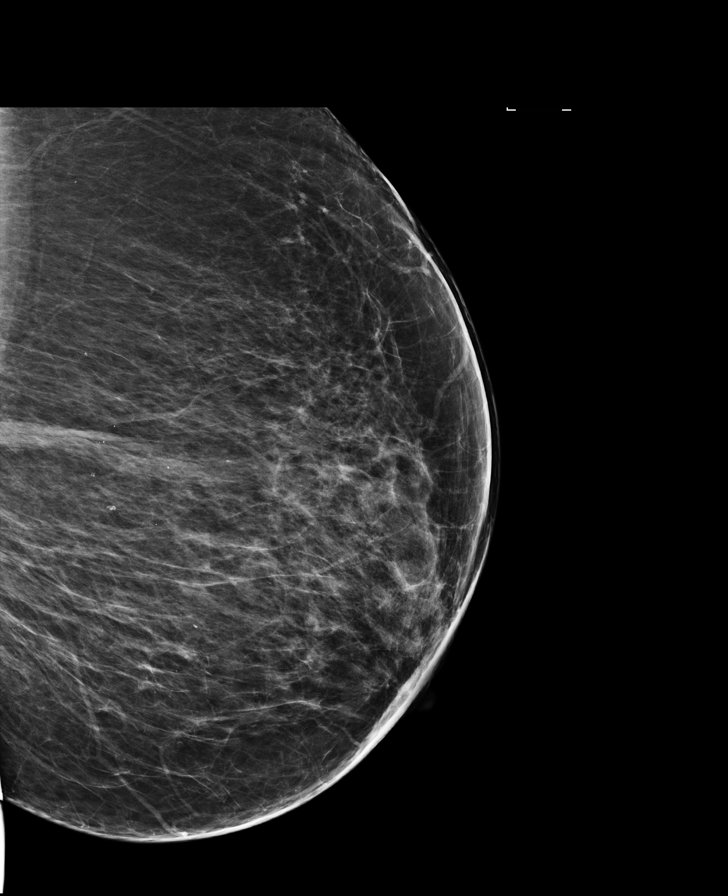

[R MLO (3 of 3)]
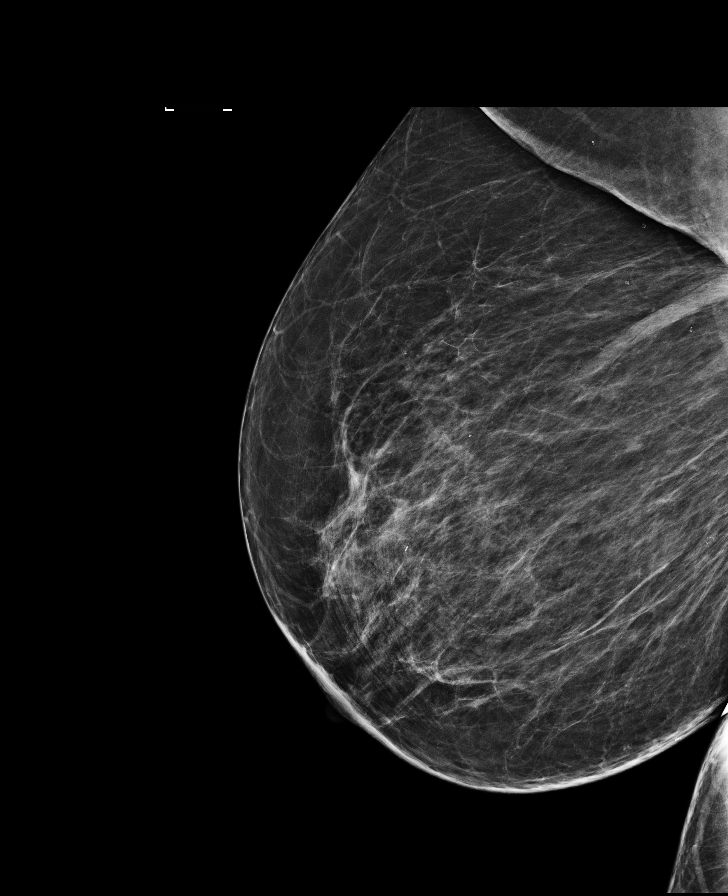

[R CC]
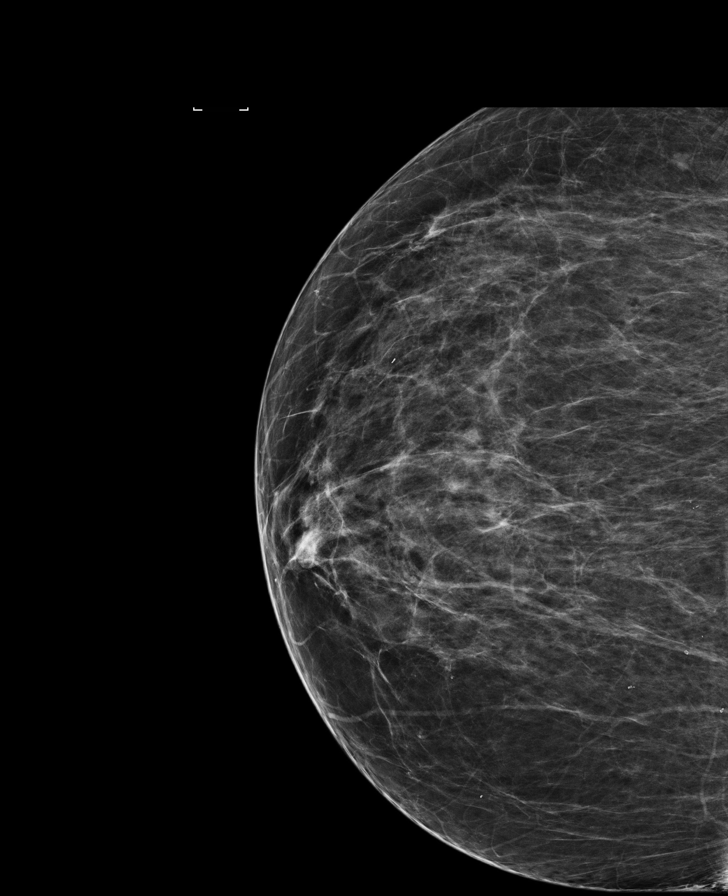

[8 of 8 positions shown; findings below may reference images not displayed]

ACR Breast Density Category b: There are scattered areas of
fibroglandular density.
FINDINGS: There are no findings suspicious for malignancy. Images were
processed with CAD.
IMPRESSION: No mammographic evidence of malignancy. A result letter of this
screening mammogram will be mailed directly to the patient.

RECOMMENDATION:
Screening mammogram in one year. (Code:AS-G-LCT)

BI-RADS CATEGORY  1: Negative.

## 2018-06-12 ENCOUNTER — Other Ambulatory Visit: Payer: Self-pay

## 2018-06-12 ENCOUNTER — Emergency Department (HOSPITAL_COMMUNITY)
Admission: EM | Admit: 2018-06-12 | Discharge: 2018-06-12 | Disposition: A | Discharge status: Home / Self Care | Payer: BLUE CROSS/BLUE SHIELD | Attending: Emergency Medicine | Admitting: Emergency Medicine

## 2018-06-12 ENCOUNTER — Encounter (HOSPITAL_COMMUNITY): Payer: Self-pay | Admitting: Emergency Medicine

## 2018-06-12 DIAGNOSIS — R42 Dizziness and giddiness: Secondary | ICD-10-CM | POA: Diagnosis not present

## 2018-06-12 DIAGNOSIS — I10 Essential (primary) hypertension: Secondary | ICD-10-CM | POA: Insufficient documentation

## 2018-06-12 DIAGNOSIS — E119 Type 2 diabetes mellitus without complications: Secondary | ICD-10-CM | POA: Insufficient documentation

## 2018-06-12 DIAGNOSIS — R112 Nausea with vomiting, unspecified: Secondary | ICD-10-CM | POA: Diagnosis not present

## 2018-06-12 DIAGNOSIS — Z79899 Other long term (current) drug therapy: Secondary | ICD-10-CM | POA: Diagnosis not present

## 2018-06-12 DIAGNOSIS — Z7984 Long term (current) use of oral hypoglycemic drugs: Secondary | ICD-10-CM | POA: Insufficient documentation

## 2018-06-12 DIAGNOSIS — Z7982 Long term (current) use of aspirin: Secondary | ICD-10-CM | POA: Diagnosis not present

## 2018-06-12 LAB — URINALYSIS, ROUTINE W REFLEX MICROSCOPIC
Bilirubin Urine: NEGATIVE
Glucose, UA: NEGATIVE mg/dL
Hgb urine dipstick: NEGATIVE
Ketones, ur: NEGATIVE mg/dL
Leukocytes, UA: NEGATIVE
Nitrite: NEGATIVE
Protein, ur: NEGATIVE mg/dL
Specific Gravity, Urine: 1.013 (ref 1.005–1.030)
pH: 5 (ref 5.0–8.0)

## 2018-06-12 LAB — COMPREHENSIVE METABOLIC PANEL
ALT: 65 U/L — ABNORMAL HIGH (ref 0–44)
AST: 48 U/L — ABNORMAL HIGH (ref 15–41)
Albumin: 3.6 g/dL (ref 3.5–5.0)
Alkaline Phosphatase: 79 U/L (ref 38–126)
Anion gap: 8 (ref 5–15)
BUN: 13 mg/dL (ref 8–23)
CO2: 27 mmol/L (ref 22–32)
Calcium: 9.6 mg/dL (ref 8.9–10.3)
Chloride: 103 mmol/L (ref 98–111)
Creatinine, Ser: 0.98 mg/dL (ref 0.44–1.00)
GFR calc Af Amer: >60 mL/min (ref >60)
GFR calc non Af Amer: >60 mL/min (ref >60)
Glucose, Bld: 206 mg/dL — ABNORMAL HIGH (ref 70–99)
Potassium: 3.8 mmol/L (ref 3.5–5.1)
Sodium: 138 mmol/L (ref 135–145)
Total Bilirubin: 0.6 mg/dL (ref 0.3–1.2)
Total Protein: 7.9 g/dL (ref 6.5–8.1)

## 2018-06-12 LAB — CBC
HCT: 36.6 % (ref 36.0–46.0)
Hemoglobin: 11.7 g/dL — ABNORMAL LOW (ref 12.0–15.0)
MCH: 27.5 pg (ref 26.0–34.0)
MCHC: 32 g/dL (ref 30.0–36.0)
MCV: 86.1 fL (ref 80.0–100.0)
Platelets: 260 10*3/uL (ref 150–400)
RBC: 4.25 MIL/uL (ref 3.87–5.11)
RDW: 13.1 % (ref 11.5–15.5)
WBC: 8.1 10*3/uL (ref 4.0–10.5)
nRBC: 0 % (ref 0.0–0.2)

## 2018-06-12 LAB — CBG MONITORING, ED: Glucose-Capillary: 180 mg/dL — ABNORMAL HIGH (ref 70–99)

## 2018-06-12 LAB — LIPASE, BLOOD: Lipase: 43 U/L (ref 11–51)

## 2018-06-12 MED ORDER — MECLIZINE HCL 12.5 MG PO TABS
25.0000 mg | ORAL_TABLET | Freq: Once | ORAL | Status: AC
  Administered 2018-06-12: 25 mg via ORAL
  Filled 2018-06-12: qty 2

## 2018-06-12 MED ORDER — MECLIZINE HCL 25 MG PO TABS: 25.0000 mg | ORAL_TABLET | Freq: Three times a day (TID) | ORAL | 0 refills | Status: DC | PRN

## 2018-06-12 MED ORDER — ONDANSETRON HCL 4 MG/2ML IJ SOLN
4.0000 mg | Freq: Once | INTRAMUSCULAR | Status: AC
  Administered 2018-06-12: 4 mg via INTRAVENOUS
  Filled 2018-06-12: qty 2

## 2018-06-12 NOTE — ED Triage Notes (Signed)
Pt reports N/V that started this morning around 0200. Pt states "it feels like the room is spinning." Pt denies pain.

## 2018-06-19 ENCOUNTER — Encounter: Payer: Self-pay | Admitting: Gastroenterology

## 2018-06-19 ENCOUNTER — Telehealth: Payer: Self-pay | Admitting: Gastroenterology

## 2018-06-19 NOTE — Telephone Encounter (Signed)
Actually Mindy is who originally scheduled this pt. Routing to her.

## 2018-06-19 NOTE — Telephone Encounter (Signed)
Pt called to cancel her TCS for 10/21 with SF and wants to wait until the first of the year

## 2018-06-19 NOTE — Telephone Encounter (Signed)
LMOVM advising procedure cancelled but will need OV prior to rescheduling as this is her 2nd time cancelling.  Called [place] in [facility] and cancelled procedure. FYI to LSL

## 2018-06-20 ENCOUNTER — Ambulatory Visit: Payer: BLUE CROSS/BLUE SHIELD | Admitting: Nutrition

## 2018-06-20 NOTE — Telephone Encounter (Signed)
Agree. Last seen in 01/2018. Will need ov.

## 2018-06-20 NOTE — Telephone Encounter (Signed)
LMTCB for pt. Will await for her return call to schedule OV

## 2018-06-24 ENCOUNTER — Ambulatory Visit (HOSPITAL_COMMUNITY)
Admission: RE | Admit: 2018-06-24 | Payer: BLUE CROSS/BLUE SHIELD | Source: Ambulatory Visit | Admitting: Gastroenterology

## 2018-06-24 ENCOUNTER — Encounter (HOSPITAL_COMMUNITY): Admission: RE | Payer: Self-pay | Source: Ambulatory Visit

## 2018-06-24 SURGERY — COLONOSCOPY
Anesthesia: Moderate Sedation

## 2018-06-24 NOTE — ED Provider Notes (Signed)
[facility] EMERGENCY DEPARTMENT Provider Note   CSN: [id number] Arrival date & time: 06/12/18  1224     History   Chief Complaint Chief Complaint  Patient presents with  . Nausea    HPI Maria Fisher is a 62 y.o. female.  HPI   62 year old female with nausea and vomiting.  Woke up around 2:00 this morning to use the bathroom.  As she was getting out of bed she began to feel very dizzy as the room was spinning.  Feeling very sick to her stomach and subsequently vomited.  Symptoms better when still.  Aggravated by movement.  Denies any acute pain.  She went to bed in her usual state of health.  Past Medical History:  Diagnosis Date  . Back pain   . Diabetes mellitus without complication ([place])   . Heart murmur   . Hyperlipidemia   . Hypertension   . Neck pain   . Neuromuscular disorder ([place])   . Shoulder pain, bilateral   . Thyroid nodule     Patient Active Problem List   Diagnosis Date Noted  . Encounter for screening colonoscopy 01/07/2018  . Abnormal LFTs 01/07/2018  . Nausea 11/20/2017  . Dyspepsia 11/20/2017  . Type 2 diabetes mellitus without complication, without long-term current use of insulin ([place]) 08/01/2017  . HLD (hyperlipidemia) 07/03/2017  . Type 2 diabetes mellitus with hyperglycemia ([place]) 06/12/2017  . HTN, goal below 130/80 06/12/2017  . CTS (carpal tunnel syndrome) 02/14/2017  . Neck pain 01/22/2017  . Hand weakness 01/22/2017  . Low back pain 01/22/2017    Past Surgical History:  Procedure Laterality Date  . ABDOMINAL HYSTERECTOMY    . CESAREAN SECTION     x 2  . CHOLECYSTECTOMY       OB History   None      Home Medications    Prior to Admission medications   Medication Sig Start Date End Date Taking? Authorizing Provider  aspirin EC 81 MG tablet Take 81 mg by mouth daily.    [provider]  atorvastatin (LIPITOR) 40 MG tablet Take 40 mg by mouth daily.  09/14/17   [provider]  blood glucose meter kit and  supplies KIT Dispense based on patient and insurance preference.lancets to fit device. 05/27/18   Caren Macadam, MD  calcium-vitamin D 250-100 MG-UNIT tablet Take 1 tablet by mouth 2 (two) times daily.    [provider]  glipiZIDE (GLIPIZIDE XL) 5 MG 24 hr tablet Take 1 tablet (5 mg total) by mouth daily with breakfast. 05/27/18   Caren Macadam, MD  glucose blood (CONTOUR NEXT TEST) test strip 1 each by Other route 2 (two) times daily. Use as instructed 05/27/18   Caren Macadam, MD  ibuprofen (ADVIL,MOTRIN) 800 MG tablet Take 800 mg by mouth every 8 (eight) hours as needed (pain).    [provider]  lisinopril-hydrochlorothiazide (PRINZIDE,ZESTORETIC) 10-12.5 MG tablet Take 1 tablet by mouth daily. 04/09/18   Caren Macadam, MD  meclizine (ANTIVERT) 25 MG tablet Take 1 tablet (25 mg total) by mouth 3 (three) times daily as needed for dizziness or nausea. 06/12/18   Virgel Manifold, MD  metFORMIN (GLUCOPHAGE) 1000 MG tablet Take 1 tablet (1,000 mg total) by mouth 2 (two) times daily with a meal. 05/27/18   Caren Macadam, MD  pantoprazole (PROTONIX) 40 MG tablet Take 1 tablet (40 mg total) by mouth daily. 05/27/18   Caren Macadam, MD    Family History Family History  Problem Relation  Age of Onset  . Diabetes Mother   . Heart disease Mother   . Kidney disease Mother   . Depression Mother   . Hypertension Mother   . Hyperlipidemia Mother   . Cirrhosis Father   . Alcohol abuse Father   . Diabetes Sister   . Hypertension Sister   . Hypertension Brother   . Hypertension Daughter   . Hypertension Son   . Colon cancer Neg Hx     Social History Social History   Tobacco Use  . Smoking status: Never Smoker  . Smokeless tobacco: Never Used  Substance Use Topics  . Alcohol use: No  . Drug use: No     Allergies   Patient has no known allergies.   Review of Systems Review of Systems  All systems reviewed and negative, other than as noted in HPI.  Physical  Exam Updated Vital Signs BP 110/60   Pulse 60   Temp 98.2 F (36.8 C) (Oral)   Resp 16   Ht '5\' 1"'  (1.549 m)   Wt 88.9 kg   SpO2 100%   BMI 37.03 kg/m   Physical Exam  Constitutional: She is oriented to person, place, and time. She appears well-developed and well-nourished. No distress.  HENT:  Head: Normocephalic and atraumatic.  Eyes: Conjunctivae are normal. Right eye exhibits no discharge. Left eye exhibits no discharge.  Neck: Neck supple.  Cardiovascular: Normal rate, regular rhythm and normal heart sounds. Exam reveals no gallop and no friction rub.  No murmur heard. Pulmonary/Chest: Effort normal and breath sounds normal. No respiratory distress.  Abdominal: Soft. She exhibits no distension. There is no tenderness.  Musculoskeletal: She exhibits no edema or tenderness.  Neurological: She is alert and oriented to person, place, and time. No cranial nerve deficit. She exhibits normal muscle tone. Coordination normal.  Good finger to nose b/l  Skin: Skin is warm and dry.  Psychiatric: She has a normal mood and affect. Her behavior is normal. Thought content normal.  Nursing note and vitals reviewed.    ED Treatments / Results  Labs (all labs ordered are listed, but only abnormal results are displayed) Labs Reviewed  COMPREHENSIVE METABOLIC PANEL - Abnormal; Notable for the following components:      Result Value   Glucose, Bld 206 (*)    AST 48 (*)    ALT 65 (*)    All other components within normal limits  CBC - Abnormal; Notable for the following components:   Hemoglobin 11.7 (*)    All other components within normal limits  CBG MONITORING, ED - Abnormal; Notable for the following components:   Glucose-Capillary 180 (*)    All other components within normal limits  LIPASE, BLOOD  URINALYSIS, ROUTINE W REFLEX MICROSCOPIC    EKG None  Radiology No results found.  Procedures Procedures (including critical care time)  Medications Ordered in  ED Medications  ondansetron (ZOFRAN) injection 4 mg (4 mg Intravenous Given 06/12/18 0633)  meclizine (ANTIVERT) tablet 25 mg (25 mg Oral Given 06/12/18 0755)     Initial Impression / Assessment and Plan / ED Course  I have reviewed the triage vital signs and the nursing notes.  Pertinent labs & imaging results that were available during my care of the patient were reviewed by me and considered in my medical decision making (see chart for details).     Likely peripheral vertigo. Doubt central cause such as cva, mass, etc. symptomatic tx.   Final Clinical Impressions(s) / ED  Diagnoses   Final diagnoses:  Vertigo    ED Discharge Orders         Ordered    meclizine (ANTIVERT) 25 MG tablet  3 times daily PRN     06/12/18 0511           Virgel Manifold, MD 06/24/18 1715

## 2018-06-24 NOTE — Telephone Encounter (Signed)
Letter mailed advising to call

## 2018-10-01 DIAGNOSIS — E1169 Type 2 diabetes mellitus with other specified complication: Secondary | ICD-10-CM | POA: Diagnosis not present

## 2018-10-01 DIAGNOSIS — I10 Essential (primary) hypertension: Secondary | ICD-10-CM | POA: Diagnosis not present

## 2018-10-01 DIAGNOSIS — E782 Mixed hyperlipidemia: Secondary | ICD-10-CM | POA: Diagnosis not present

## 2018-10-01 DIAGNOSIS — Z1211 Encounter for screening for malignant neoplasm of colon: Secondary | ICD-10-CM | POA: Diagnosis not present

## 2018-10-01 DIAGNOSIS — R358 Other polyuria: Secondary | ICD-10-CM | POA: Diagnosis not present

## 2018-10-07 DIAGNOSIS — I10 Essential (primary) hypertension: Secondary | ICD-10-CM | POA: Diagnosis not present

## 2018-10-07 DIAGNOSIS — E782 Mixed hyperlipidemia: Secondary | ICD-10-CM | POA: Diagnosis not present

## 2018-10-07 DIAGNOSIS — Z23 Encounter for immunization: Secondary | ICD-10-CM | POA: Diagnosis not present

## 2018-10-07 DIAGNOSIS — E1169 Type 2 diabetes mellitus with other specified complication: Secondary | ICD-10-CM | POA: Diagnosis not present

## 2018-10-18 ENCOUNTER — Other Ambulatory Visit: Payer: Self-pay | Admitting: Surgery

## 2018-10-18 DIAGNOSIS — D440 Neoplasm of uncertain behavior of thyroid gland: Secondary | ICD-10-CM

## 2018-10-18 DIAGNOSIS — E042 Nontoxic multinodular goiter: Secondary | ICD-10-CM

## 2018-10-24 ENCOUNTER — Other Ambulatory Visit: Payer: BLUE CROSS/BLUE SHIELD

## 2018-10-31 ENCOUNTER — Ambulatory Visit
Admission: RE | Admit: 2018-10-31 | Discharge: 2018-10-31 | Disposition: A | Discharge status: Home / Self Care | Payer: BLUE CROSS/BLUE SHIELD | Source: Ambulatory Visit | Attending: Surgery | Admitting: Surgery

## 2018-10-31 DIAGNOSIS — D440 Neoplasm of uncertain behavior of thyroid gland: Secondary | ICD-10-CM

## 2018-10-31 DIAGNOSIS — E042 Nontoxic multinodular goiter: Secondary | ICD-10-CM

## 2018-11-01 DIAGNOSIS — E1169 Type 2 diabetes mellitus with other specified complication: Secondary | ICD-10-CM | POA: Diagnosis not present

## 2018-11-01 DIAGNOSIS — F419 Anxiety disorder, unspecified: Secondary | ICD-10-CM | POA: Diagnosis not present

## 2018-11-01 DIAGNOSIS — E782 Mixed hyperlipidemia: Secondary | ICD-10-CM | POA: Diagnosis not present

## 2018-11-01 DIAGNOSIS — I10 Essential (primary) hypertension: Secondary | ICD-10-CM | POA: Diagnosis not present

## 2018-12-31 DIAGNOSIS — E1169 Type 2 diabetes mellitus with other specified complication: Secondary | ICD-10-CM | POA: Diagnosis not present

## 2018-12-31 DIAGNOSIS — G479 Sleep disorder, unspecified: Secondary | ICD-10-CM | POA: Diagnosis not present

## 2018-12-31 DIAGNOSIS — E782 Mixed hyperlipidemia: Secondary | ICD-10-CM | POA: Diagnosis not present

## 2018-12-31 DIAGNOSIS — I10 Essential (primary) hypertension: Secondary | ICD-10-CM | POA: Diagnosis not present

## 2019-03-27 DIAGNOSIS — E119 Type 2 diabetes mellitus without complications: Secondary | ICD-10-CM | POA: Diagnosis not present

## 2019-07-01 DIAGNOSIS — E042 Nontoxic multinodular goiter: Secondary | ICD-10-CM | POA: Diagnosis not present

## 2019-07-02 DIAGNOSIS — E0590 Thyrotoxicosis, unspecified without thyrotoxic crisis or storm: Secondary | ICD-10-CM | POA: Diagnosis not present

## 2019-07-02 DIAGNOSIS — E042 Nontoxic multinodular goiter: Secondary | ICD-10-CM | POA: Diagnosis not present

## 2019-07-17 DIAGNOSIS — Z23 Encounter for immunization: Secondary | ICD-10-CM | POA: Diagnosis not present

## 2019-07-17 DIAGNOSIS — I10 Essential (primary) hypertension: Secondary | ICD-10-CM | POA: Diagnosis not present

## 2019-07-17 DIAGNOSIS — E782 Mixed hyperlipidemia: Secondary | ICD-10-CM | POA: Diagnosis not present

## 2019-07-17 DIAGNOSIS — E1169 Type 2 diabetes mellitus with other specified complication: Secondary | ICD-10-CM | POA: Diagnosis not present

## 2019-08-14 ENCOUNTER — Other Ambulatory Visit: Payer: Self-pay

## 2019-08-14 DIAGNOSIS — Z20822 Contact with and (suspected) exposure to covid-19: Secondary | ICD-10-CM

## 2019-08-14 DIAGNOSIS — Z20828 Contact with and (suspected) exposure to other viral communicable diseases: Secondary | ICD-10-CM | POA: Diagnosis not present

## 2019-08-15 LAB — NOVEL CORONAVIRUS, NAA: SARS-CoV-2, NAA: NOT DETECTED

## 2019-08-27 ENCOUNTER — Telehealth: Payer: Self-pay | Admitting: Family Medicine

## 2019-08-27 NOTE — Telephone Encounter (Signed)
Patient informed of negative covid 19 result. Patient verbalized understand.

## 2019-08-28 ENCOUNTER — Ambulatory Visit: Payer: Medicare HMO | Attending: Internal Medicine

## 2019-08-28 ENCOUNTER — Other Ambulatory Visit: Payer: Self-pay

## 2019-08-28 DIAGNOSIS — Z20828 Contact with and (suspected) exposure to other viral communicable diseases: Secondary | ICD-10-CM | POA: Diagnosis not present

## 2019-08-28 DIAGNOSIS — Z20822 Contact with and (suspected) exposure to covid-19: Secondary | ICD-10-CM

## 2019-08-29 ENCOUNTER — Encounter (HOSPITAL_COMMUNITY): Payer: Self-pay | Admitting: Emergency Medicine

## 2019-08-29 ENCOUNTER — Other Ambulatory Visit: Payer: Self-pay

## 2019-08-29 ENCOUNTER — Emergency Department (HOSPITAL_COMMUNITY)
Admission: EM | Admit: 2019-08-29 | Discharge: 2019-08-29 | Disposition: A | Discharge status: Home / Self Care | Payer: BC Managed Care – PPO | Attending: Emergency Medicine | Admitting: Emergency Medicine

## 2019-08-29 DIAGNOSIS — Z7982 Long term (current) use of aspirin: Secondary | ICD-10-CM | POA: Diagnosis not present

## 2019-08-29 DIAGNOSIS — I10 Essential (primary) hypertension: Secondary | ICD-10-CM | POA: Insufficient documentation

## 2019-08-29 DIAGNOSIS — E119 Type 2 diabetes mellitus without complications: Secondary | ICD-10-CM | POA: Insufficient documentation

## 2019-08-29 DIAGNOSIS — R519 Headache, unspecified: Secondary | ICD-10-CM | POA: Diagnosis not present

## 2019-08-29 DIAGNOSIS — U071 COVID-19: Secondary | ICD-10-CM | POA: Insufficient documentation

## 2019-08-29 DIAGNOSIS — Z79899 Other long term (current) drug therapy: Secondary | ICD-10-CM | POA: Insufficient documentation

## 2019-08-29 LAB — CBG MONITORING, ED: Glucose-Capillary: 143 mg/dL — ABNORMAL HIGH (ref 70–99)

## 2019-08-29 LAB — NOVEL CORONAVIRUS, NAA: SARS-CoV-2, NAA: DETECTED — AB

## 2019-08-29 NOTE — ED Triage Notes (Signed)
Patient states she has been exposed to Covid from a couple different people. Began feeling bad 3-4 days ago. Patient reports headaches, sweats, weakness, cough, and loss of appetite.

## 2019-08-29 NOTE — ED Provider Notes (Signed)
Maria Fisher Provider Note   CSN: [id number] Arrival date & time: 08/29/19  1624     History Chief Complaint  Patient presents with  . Exposure to Covid    Maria Fisher is a 63 y.o. female who presents with flulike symptoms including headaches, cough, body aches, chills, intermittent diarrhea, nausea for the past several days.  She was tested for coronavirus but states that she is unsure what her results are.  Patient shows me her phone which states that the virus was detected.  She is diabetic.  She denies urinary symptoms.  She has been taking Tylenol for her pain.  Her husband is also here being tested.  HPI     Past Medical History:  Diagnosis Date  . Back pain   . Diabetes mellitus without complication ([place])   . Heart murmur   . Hyperlipidemia   . Hypertension   . Neck pain   . Neuromuscular disorder ([place])   . Shoulder pain, bilateral   . Thyroid nodule     Patient Active Problem List   Diagnosis Date Noted  . Encounter for screening colonoscopy 01/07/2018  . Abnormal LFTs 01/07/2018  . Nausea 11/20/2017  . Dyspepsia 11/20/2017  . Type 2 diabetes mellitus without complication, without long-term current use of insulin ([place]) 08/01/2017  . HLD (hyperlipidemia) 07/03/2017  . Type 2 diabetes mellitus with hyperglycemia ([place]) 06/12/2017  . HTN, goal below 130/80 06/12/2017  . CTS (carpal tunnel syndrome) 02/14/2017  . Neck pain 01/22/2017  . Hand weakness 01/22/2017  . Low back pain 01/22/2017    Past Surgical History:  Procedure Laterality Date  . ABDOMINAL HYSTERECTOMY    . CESAREAN SECTION     x 2  . CHOLECYSTECTOMY       OB History    Gravida  3   Para  2   Term  2   Preterm      AB  1   Living        SAB  1   TAB      Ectopic      Multiple      Live Births              Family History  Problem Relation Age of Onset  . Diabetes Mother   . Heart disease Mother   . Kidney disease Mother   . Depression  Mother   . Hypertension Mother   . Hyperlipidemia Mother   . Cirrhosis Father   . Alcohol abuse Father   . Diabetes Sister   . Hypertension Sister   . Hypertension Brother   . Hypertension Daughter   . Hypertension Son   . Colon cancer Neg Hx     Social History   Tobacco Use  . Smoking status: Never Smoker  . Smokeless tobacco: Never Used  Substance Use Topics  . Alcohol use: No  . Drug use: No    Home Medications Prior to Admission medications   Medication Sig Start Date End Date Taking? Authorizing Provider  aspirin EC 81 MG tablet Take 81 mg by mouth daily.    [provider]  atorvastatin (LIPITOR) 40 MG tablet Take 40 mg by mouth daily.  09/14/17   [provider]  blood glucose meter kit and supplies KIT Dispense based on patient and insurance preference.lancets to fit device. 05/27/18   Caren Macadam, MD  calcium-vitamin D 250-100 MG-UNIT tablet Take 1 tablet by mouth 2 (two) times daily.  [provider]  glipiZIDE (GLIPIZIDE XL) 5 MG 24 hr tablet Take 1 tablet (5 mg total) by mouth daily with breakfast. 05/27/18   Caren Macadam, MD  glucose blood (CONTOUR NEXT TEST) test strip 1 each by Other route 2 (two) times daily. Use as instructed 05/27/18   Caren Macadam, MD  ibuprofen (ADVIL,MOTRIN) 800 MG tablet Take 800 mg by mouth every 8 (eight) hours as needed (pain).    [provider]  lisinopril-hydrochlorothiazide (PRINZIDE,ZESTORETIC) 10-12.5 MG tablet Take 1 tablet by mouth daily. 04/09/18   Caren Macadam, MD  meclizine (ANTIVERT) 25 MG tablet Take 1 tablet (25 mg total) by mouth 3 (three) times daily as needed for dizziness or nausea. 06/12/18   Virgel Manifold, MD  metFORMIN (GLUCOPHAGE) 1000 MG tablet Take 1 tablet (1,000 mg total) by mouth 2 (two) times daily with a meal. 05/27/18   Caren Macadam, MD  pantoprazole (PROTONIX) 40 MG tablet Take 1 tablet (40 mg total) by mouth daily. 05/27/18   Caren Macadam, MD    Allergies      Patient has no known allergies.  Review of Systems   Review of Systems Ten systems reviewed and are negative for acute change, except as noted in the HPI.   Physical Exam Updated Vital Signs BP (!) 144/86 (BP Location: Right Arm)   Pulse 89   Temp 99.8 F (37.7 C) (Oral)   Resp 16   Ht '5\' 1"'  (1.549 m)   Wt 87.1 kg   SpO2 97%   BMI 36.28 kg/m   Physical Exam Vitals and nursing note reviewed.  Constitutional:      General: She is not in acute distress.    Appearance: She is well-developed. She is ill-appearing. She is not toxic-appearing or diaphoretic.  HENT:     Head: Normocephalic and atraumatic.  Eyes:     General: No scleral icterus.    Conjunctiva/sclera: Conjunctivae normal.  Cardiovascular:     Rate and Rhythm: Normal rate and regular rhythm.     Heart sounds: Normal heart sounds. No murmur. No friction rub. No gallop.   Pulmonary:     Effort: Pulmonary effort is normal. No respiratory distress.     Breath sounds: Normal breath sounds. No wheezing.  Abdominal:     General: Bowel sounds are normal. There is no distension.     Palpations: Abdomen is soft. There is no mass.     Tenderness: There is no abdominal tenderness. There is no guarding.  Musculoskeletal:     Cervical back: Normal range of motion.  Skin:    General: Skin is warm and dry.  Neurological:     Mental Status: She is alert and oriented to person, place, and time.  Psychiatric:        Behavior: Behavior normal.     ED Results / Procedures / Treatments   Labs (all labs ordered are listed, but only abnormal results are displayed) Labs Reviewed - No data to display  EKG None  Radiology No results found.  Procedures Procedures (including critical care time)  Medications Ordered in ED Medications - No data to display  ED Course  I have reviewed the triage vital signs and the nursing notes.  Pertinent labs & imaging results that were available during my care of the patient were  reviewed by me and considered in my medical decision making (see chart for details).    MDM Rules/Calculators/A&P  BP 130/87 (BP Location: Right Arm)   Pulse 83   Temp 99.8 F (37.7 C) (Oral)   Resp 16   Ht '5\' 1"'  (1.549 m)   Wt 87.1 kg   SpO2 97%   BMI 36.21 kg/m   63 year old female with type 2 diabetes who is Covid positive.  She has normal vital signs without fever.  Blood sugar of 147.  Discharged home with home isolation precautions.  Clinical Impression(s) / ED Diagnoses Final diagnoses:  [id number]    Rx / DC Orders ED Discharge Orders    None       Margarita Mail, PA-C 08/29/19 2322    Milton Ferguson, MD 08/30/19 [phone]

## 2019-08-29 NOTE — Discharge Instructions (Addendum)
Person Under Monitoring Name: Maria Fisher  Location: [address] [place] [address]   Infection Prevention Recommendations for Individuals Confirmed to have, or Being Evaluated for, 2019 Novel Coronavirus (COVID-19) Infection Who Receive Care at Home  Individuals who are confirmed to have, or are being evaluated for, COVID-19 should follow the prevention steps below until a healthcare provider or local or state health department says they can return to normal activities.  Stay home except to get medical care You should restrict activities outside your home, except for getting medical care. Do not go to work, school, or public areas, and do not use public transportation or taxis.  Call ahead before visiting your doctor Before your medical appointment, call the healthcare provider and tell them that you have, or are being evaluated for, COVID-19 infection. This will help the healthcare provider's office take steps to keep other people from getting infected. Ask your healthcare provider to call the local or state health department.  Monitor your symptoms Seek prompt medical attention if your illness is worsening (e.g., difficulty breathing). Before going to your medical appointment, call the healthcare provider and tell them that you have, or are being evaluated for, COVID-19 infection. Ask your healthcare provider to call the local or state health department.  Wear a facemask You should wear a facemask that covers your nose and mouth when you are in the same room with other people and when you visit a healthcare provider. People who live with or visit you should also wear a facemask while they are in the same room with you.  Separate yourself from other people in your home As much as possible, you should stay in a different room from other people in your home. Also, you should use a separate bathroom, if available.  Avoid sharing household items You should not  share dishes, drinking glasses, cups, eating utensils, towels, bedding, or other items with other people in your home. After using these items, you should wash them thoroughly with soap and water.  Cover your coughs and sneezes Cover your mouth and nose with a tissue when you cough or sneeze, or you can cough or sneeze into your sleeve. Throw used tissues in a lined trash can, and immediately wash your hands with soap and water for at least 20 seconds or use an alcohol-based hand rub.  Wash your [facility] your hands often and thoroughly with soap and water for at least 20 seconds. You can use an alcohol-based hand sanitizer if soap and water are not available and if your hands are not visibly dirty. Avoid touching your eyes, nose, and mouth with unwashed hands.   Prevention Steps for Caregivers and Household Members of Individuals Confirmed to have, or Being Evaluated for, COVID-19 Infection Being Cared for in the Home  If you live with, or provide care at home for, a person confirmed to have, or being evaluated for, COVID-19 infection please follow these guidelines to prevent infection:  Follow healthcare provider's instructions Make sure that you understand and can help the patient follow any healthcare provider instructions for all care.  Provide for the patient's basic needs You should help the patient with basic needs in the home and provide support for getting groceries, prescriptions, and other personal needs.  Monitor the patient's symptoms If they are getting sicker, call his or her medical provider and tell them that the patient has, or is being evaluated for, COVID-19 infection. This will help the healthcare provider's  office take steps to keep other people from getting infected. Ask the healthcare provider to call the local or state health department.  Limit the number of people who have contact with the patient If possible, have only one caregiver for the  patient. Other household members should stay in another home or place of residence. If this is not possible, they should stay in another room, or be separated from the patient as much as possible. Use a separate bathroom, if available. Restrict visitors who do not have an essential need to be in the home.  Keep older adults, very young children, and other sick people away from the patient Keep older adults, very young children, and those who have compromised immune systems or chronic health conditions away from the patient. This includes people with chronic heart, lung, or kidney conditions, diabetes, and cancer.  Ensure good ventilation Make sure that shared spaces in the home have good air flow, such as from an air conditioner or an opened window, weather permitting.  Wash your hands often Wash your hands often and thoroughly with soap and water for at least 20 seconds. You can use an alcohol based hand sanitizer if soap and water are not available and if your hands are not visibly dirty. Avoid touching your eyes, nose, and mouth with unwashed hands. Use disposable paper towels to dry your hands. If not available, use dedicated cloth towels and replace them when they become wet.  Wear a facemask and gloves Wear a disposable facemask at all times in the room and gloves when you touch or have contact with the patient's blood, body fluids, and/or secretions or excretions, such as sweat, saliva, sputum, nasal mucus, vomit, urine, or feces.  Ensure the mask fits over your nose and mouth tightly, and do not touch it during use. Throw out disposable facemasks and gloves after using them. Do not reuse. Wash your hands immediately after removing your facemask and gloves. If your personal clothing becomes contaminated, carefully remove clothing and launder. Wash your hands after handling contaminated clothing. Place all used disposable facemasks, gloves, and other waste in a lined container before  disposing them with other household waste. Remove gloves and wash your hands immediately after handling these items.  Do not share dishes, glasses, or other household items with the patient Avoid sharing household items. You should not share dishes, drinking glasses, cups, eating utensils, towels, bedding, or other items with a patient who is confirmed to have, or being evaluated for, COVID-19 infection. After the person uses these items, you should wash them thoroughly with soap and water.  Wash laundry thoroughly Immediately remove and wash clothes or bedding that have blood, body fluids, and/or secretions or excretions, such as sweat, saliva, sputum, nasal mucus, vomit, urine, or feces, on them. Wear gloves when handling laundry from the patient. Read and follow directions on labels of laundry or clothing items and detergent. In general, wash and dry with the warmest temperatures recommended on the label.  Clean all areas the individual has used often Clean all touchable surfaces, such as counters, tabletops, doorknobs, bathroom fixtures, toilets, phones, keyboards, tablets, and bedside tables, every day. Also, clean any surfaces that may have blood, body fluids, and/or secretions or excretions on them. Wear gloves when cleaning surfaces the patient has come in contact with. Use a diluted bleach solution (e.g., dilute bleach with 1 part bleach and 10 parts water) or a household disinfectant with a label that says EPA-registered for coronaviruses. To make a  bleach solution at home, add 1 tablespoon of bleach to 1 quart (4 cups) of water. For a larger supply, add  cup of bleach to 1 gallon (16 cups) of water. Read labels of cleaning products and follow recommendations provided on product labels. Labels contain instructions for safe and effective use of the cleaning product including precautions you should take when applying the product, such as wearing gloves or eye protection and making sure you  have good ventilation during use of the product. Remove gloves and wash hands immediately after cleaning.  Monitor yourself for signs and symptoms of illness Caregivers and household members are considered close contacts, should monitor their health, and will be asked to limit movement outside of the home to the extent possible. Follow the monitoring steps for close contacts listed on the symptom monitoring form.   ? If you have additional questions, contact your local health department or call the epidemiologist on call at [phone] (available 24/7). ? This guidance is subject to change. For the most up-to-date guidance from [facility], please refer to their website: [email or web address]

## 2019-08-29 NOTE — ED Notes (Signed)
Pt unable to sign discharge due to signature pad not working.

## 2019-08-30 ENCOUNTER — Other Ambulatory Visit: Payer: Self-pay | Admitting: Unknown Physician Specialty

## 2019-08-30 ENCOUNTER — Telehealth: Payer: Self-pay | Admitting: Unknown Physician Specialty

## 2019-08-30 DIAGNOSIS — U071 COVID-19: Secondary | ICD-10-CM

## 2019-08-30 NOTE — Telephone Encounter (Signed)
  I connected by phone with Maria Fisher on 08/30/2019 at 11:20 AM to discuss the potential use of an new treatment for mild to moderate COVID-19 viral infection in non-hospitalized patients.  This patient is a 63 y.o. female that meets the FDA criteria for Emergency Use Authorization of bamlanivimab or casirivimab\imdevimab.  Has a (+) direct SARS-CoV-2 viral test result  Has mild or moderate COVID-19   Is ? 65 years of age and weighs ? 40 kg  Is NOT hospitalized due to COVID-19  Is NOT requiring oxygen therapy or requiring an increase in baseline oxygen flow rate due to COVID-19  Is within 10 days of symptom onset  Has at least one of the high risk factor(s) for progression to severe COVID-19 and/or hospitalization as defined in EUA.  Specific high risk criteria : >/= 65 yo and diabetes    I have spoken and communicated the following to the patient or parent/caregiver:  1. FDA has authorized the emergency use of bamlanivimab and casirivimab\imdevimab for the treatment of mild to moderate COVID-19 in adults and pediatric patients with positive results of direct SARS-CoV-2 viral testing who are 82 years of age and older weighing at least 40 kg, and who are at high risk for progressing to severe COVID-19 and/or hospitalization.  2. The significant known and potential risks and benefits of bamlanivimab and casirivimab\imdevimab, and the extent to which such potential risks and benefits are unknown.  3. Information on available alternative treatments and the risks and benefits of those alternatives, including clinical trials.  4. Patients treated with bamlanivimab and casirivimab\imdevimab should continue to self-isolate and use infection control measures (e.g., wear mask, isolate, social distance, avoid sharing personal items, clean and disinfect "high touch" surfaces, and frequent handwashing) according to CDC guidelines.   5. The patient or parent/caregiver has the option to accept  or refuse bamlanivimab or casirivimab\imdevimab .  After reviewing this information with the patient, The patient agreed to proceed with receiving the bamlanimivab infusion and will be provided a copy of the Fact sheet prior to receiving the infusion.[name] 08/30/2019 11:20 AM   Symptom onset 12/23

## 2019-09-02 ENCOUNTER — Encounter (HOSPITAL_COMMUNITY): Payer: Self-pay

## 2019-09-02 ENCOUNTER — Ambulatory Visit (HOSPITAL_COMMUNITY)
Admission: RE | Admit: 2019-09-02 | Discharge: 2019-09-02 | Disposition: A | Discharge status: Home / Self Care | Payer: Medicare Other | Source: Ambulatory Visit | Attending: Pulmonary Disease | Admitting: Pulmonary Disease

## 2019-09-02 DIAGNOSIS — Z23 Encounter for immunization: Secondary | ICD-10-CM | POA: Insufficient documentation

## 2019-09-02 DIAGNOSIS — U071 COVID-19: Secondary | ICD-10-CM | POA: Diagnosis not present

## 2019-09-02 MED ORDER — METHYLPREDNISOLONE SODIUM SUCC 125 MG IJ SOLR: 125.0000 mg | Freq: Once | INTRAMUSCULAR | Status: DC | PRN

## 2019-09-02 MED ORDER — DIPHENHYDRAMINE HCL 50 MG/ML IJ SOLN: 50.0000 mg | Freq: Once | INTRAMUSCULAR | Status: DC | PRN

## 2019-09-02 MED ORDER — SODIUM CHLORIDE 0.9 % IV SOLN
Freq: Once | INTRAVENOUS | Status: AC
  Filled 2019-09-02: qty 10

## 2019-09-02 MED ORDER — ALBUTEROL SULFATE HFA 108 (90 BASE) MCG/ACT IN AERS: 2.0000 | INHALATION_SPRAY | Freq: Once | RESPIRATORY_TRACT | Status: DC | PRN

## 2019-09-02 MED ORDER — SODIUM CHLORIDE 0.9 % IV SOLN
INTRAVENOUS | Status: DC | PRN
  Administered 2019-09-02: 250 mL via INTRAVENOUS

## 2019-09-02 MED ORDER — EPINEPHRINE 0.3 MG/0.3ML IJ SOAJ: 0.3000 mg | Freq: Once | INTRAMUSCULAR | Status: DC | PRN

## 2019-09-02 MED ORDER — FAMOTIDINE IN NACL 20-0.9 MG/50ML-% IV SOLN: 20.0000 mg | Freq: Once | INTRAVENOUS | Status: DC | PRN

## 2019-09-02 NOTE — Discharge Instructions (Signed)
10 Things You Can Do to Manage Your COVID-19 Symptoms at Home If you have possible or confirmed COVID-19: 1. Stay home from work and school. And stay away from other public places. If you must go out, avoid using any kind of public transportation, ridesharing, or taxis. 2. Monitor your symptoms carefully. If your symptoms get worse, call your healthcare provider immediately. 3. Get rest and stay hydrated. 4. If you have a medical appointment, call the healthcare provider ahead of time and tell them that you have or may have COVID-19. 5. For medical emergencies, call 911 and notify the dispatch personnel that you have or may have COVID-19. 6. Cover your cough and sneezes with a tissue or use the inside of your elbow. 7. Wash your hands often with soap and water for at least 20 seconds or clean your hands with an alcohol-based hand sanitizer that contains at least 60% alcohol. 8. As much as possible, stay in a specific room and away from other people in your home. Also, you should use a separate bathroom, if available. If you need to be around other people in or outside of the home, wear a mask. 9. Avoid sharing personal items with other people in your household, like dishes, towels, and bedding. 10. Clean all surfaces that are touched often, like counters, tabletops, and doorknobs. Use household cleaning sprays or wipes according to the label instructions. cdc.gov/coronavirus 03/05/2019 This information is not intended to replace advice given to you by your health care provider. Make sure you discuss any questions you have with your health care provider. Document Revised: 08/07/2019 Document Reviewed: 08/07/2019 Elsevier Patient Education  2020 Elsevier Inc.  

## 2019-09-02 NOTE — Progress Notes (Signed)
  Diagnosis: COVID-19  Physician: Caren Macadam, MD  Procedure: [facility] Infusion [facility] Med: casirivimab\imdevimab infusion - Provided patient with casirivimab\imdevimab fact sheet for patients, parents and caregivers prior to infusion.  Complications: No immediate complications noted.  Discharge: Discharged home   Maria Fisher 09/02/2019

## 2019-10-17 DIAGNOSIS — E782 Mixed hyperlipidemia: Secondary | ICD-10-CM | POA: Diagnosis not present

## 2019-10-17 DIAGNOSIS — R69 Illness, unspecified: Secondary | ICD-10-CM | POA: Diagnosis not present

## 2019-10-17 DIAGNOSIS — E1169 Type 2 diabetes mellitus with other specified complication: Secondary | ICD-10-CM | POA: Diagnosis not present

## 2019-10-17 DIAGNOSIS — I10 Essential (primary) hypertension: Secondary | ICD-10-CM | POA: Diagnosis not present

## 2019-10-17 DIAGNOSIS — Z7984 Long term (current) use of oral hypoglycemic drugs: Secondary | ICD-10-CM | POA: Diagnosis not present

## 2019-10-22 DIAGNOSIS — Z7984 Long term (current) use of oral hypoglycemic drugs: Secondary | ICD-10-CM | POA: Diagnosis not present

## 2019-10-22 DIAGNOSIS — E1169 Type 2 diabetes mellitus with other specified complication: Secondary | ICD-10-CM | POA: Diagnosis not present

## 2020-02-13 DIAGNOSIS — Z1211 Encounter for screening for malignant neoplasm of colon: Secondary | ICD-10-CM | POA: Diagnosis not present

## 2020-02-13 DIAGNOSIS — E782 Mixed hyperlipidemia: Secondary | ICD-10-CM | POA: Diagnosis not present

## 2020-02-13 DIAGNOSIS — I10 Essential (primary) hypertension: Secondary | ICD-10-CM | POA: Diagnosis not present

## 2020-02-13 DIAGNOSIS — E1169 Type 2 diabetes mellitus with other specified complication: Secondary | ICD-10-CM | POA: Diagnosis not present

## 2020-02-13 DIAGNOSIS — Z0000 Encounter for general adult medical examination without abnormal findings: Secondary | ICD-10-CM | POA: Diagnosis not present

## 2020-02-13 DIAGNOSIS — Z79899 Other long term (current) drug therapy: Secondary | ICD-10-CM | POA: Diagnosis not present

## 2020-02-13 DIAGNOSIS — E2839 Other primary ovarian failure: Secondary | ICD-10-CM | POA: Diagnosis not present

## 2020-02-13 DIAGNOSIS — Z1159 Encounter for screening for other viral diseases: Secondary | ICD-10-CM | POA: Diagnosis not present

## 2020-02-13 DIAGNOSIS — Z01419 Encounter for gynecological examination (general) (routine) without abnormal findings: Secondary | ICD-10-CM | POA: Diagnosis not present

## 2020-02-13 DIAGNOSIS — E041 Nontoxic single thyroid nodule: Secondary | ICD-10-CM | POA: Diagnosis not present

## 2020-02-23 ENCOUNTER — Other Ambulatory Visit (HOSPITAL_COMMUNITY): Payer: Self-pay | Admitting: Family Medicine

## 2020-02-23 DIAGNOSIS — Z1231 Encounter for screening mammogram for malignant neoplasm of breast: Secondary | ICD-10-CM

## 2020-02-24 ENCOUNTER — Other Ambulatory Visit (HOSPITAL_COMMUNITY): Payer: Self-pay | Admitting: Family Medicine

## 2020-03-04 ENCOUNTER — Other Ambulatory Visit (HOSPITAL_COMMUNITY): Payer: Self-pay | Admitting: Family Medicine

## 2020-03-04 DIAGNOSIS — M810 Age-related osteoporosis without current pathological fracture: Secondary | ICD-10-CM

## 2020-03-23 DIAGNOSIS — E782 Mixed hyperlipidemia: Secondary | ICD-10-CM | POA: Diagnosis not present

## 2020-03-23 DIAGNOSIS — Z7984 Long term (current) use of oral hypoglycemic drugs: Secondary | ICD-10-CM | POA: Diagnosis not present

## 2020-03-23 DIAGNOSIS — Z1211 Encounter for screening for malignant neoplasm of colon: Secondary | ICD-10-CM | POA: Diagnosis not present

## 2020-03-23 DIAGNOSIS — I10 Essential (primary) hypertension: Secondary | ICD-10-CM | POA: Diagnosis not present

## 2020-03-23 DIAGNOSIS — E1169 Type 2 diabetes mellitus with other specified complication: Secondary | ICD-10-CM | POA: Diagnosis not present

## 2020-04-15 ENCOUNTER — Ambulatory Visit (HOSPITAL_COMMUNITY): Payer: Medicare Other

## 2020-04-19 ENCOUNTER — Other Ambulatory Visit: Payer: Self-pay

## 2020-04-19 ENCOUNTER — Other Ambulatory Visit (HOSPITAL_COMMUNITY): Payer: Medicare Other

## 2020-04-19 ENCOUNTER — Ambulatory Visit
Admission: EM | Admit: 2020-04-19 | Discharge: 2020-04-19 | Disposition: A | Discharge status: Home / Self Care | Payer: Medicare HMO | Attending: Emergency Medicine | Admitting: Emergency Medicine

## 2020-04-19 DIAGNOSIS — R6889 Other general symptoms and signs: Secondary | ICD-10-CM

## 2020-04-19 DIAGNOSIS — R110 Nausea: Secondary | ICD-10-CM

## 2020-04-19 DIAGNOSIS — Z1152 Encounter for screening for COVID-19: Secondary | ICD-10-CM

## 2020-04-19 DIAGNOSIS — Z20822 Contact with and (suspected) exposure to covid-19: Secondary | ICD-10-CM

## 2020-04-19 MED ORDER — BENZONATATE 100 MG PO CAPS: 100.0000 mg | ORAL_CAPSULE | Freq: Three times a day (TID) | ORAL | 0 refills | Status: DC

## 2020-04-19 MED ORDER — ONDANSETRON HCL 4 MG PO TABS: 4.0000 mg | ORAL_TABLET | Freq: Four times a day (QID) | ORAL | 0 refills | Status: DC

## 2020-04-19 NOTE — Discharge Instructions (Signed)
COVID testing ordered.  It will take between 2-5 days for test results.  Someone will contact you regarding abnormal results.    In the meantime: You should remain isolated in your home for 10 days from symptom onset AND greater than 72 hours after symptoms resolution (absence of fever without the use of fever-reducing medication and improvement in respiratory symptoms), whichever is longer Get plenty of rest and push fluids Tessalon Perles prescribed for cough Zofran for nausea Use OTC zyrtec for nasal congestion, runny nose, and/or sore throat Use OTC flonase for nasal congestion and runny nose Use medications daily for symptom relief Use OTC medications like ibuprofen or tylenol as needed fever or pain Call or go to the ED if you have any new or worsening symptoms such as fever, worsening cough, shortness of breath, chest tightness, chest pain, turning blue, changes in mental status, etc..Marland Kitchen

## 2020-04-19 NOTE — ED Triage Notes (Signed)
Pt presents with  c/o nausea , cough and feeling unwell for past week, had positive covid exposure. Pt has had both vaccines

## 2020-04-19 NOTE — ED Provider Notes (Signed)
[facility]   [id number] 04/19/20 Arrival Time: 6063   CC: COVID symptoms  SUBJECTIVE: History from: patient.  Maria Fisher is a 64 y.o. female who presents with fatigue, congestion, cough, fever of 100 at home, cough, and nausea x 1 week.  Reports positive COVID exposure at husband work.  Has tried OTC medications without relief.  Denies aggravating factors.  Reports previous COVID infection in the past with similar symptoms.   Denies sinus pain, rhinorrhea, sore throat, SOB, wheezing, chest pain, vomiting, changes in bowel or bladder habits.    ROS: As per HPI.  All other pertinent ROS negative.     Past Medical History:  Diagnosis Date  . Back pain   . Diabetes mellitus without complication ([place])   . Heart murmur   . Hyperlipidemia   . Hypertension   . Neck pain   . Neuromuscular disorder ([place])   . Shoulder pain, bilateral   . Thyroid nodule    Past Surgical History:  Procedure Laterality Date  . ABDOMINAL HYSTERECTOMY    . CESAREAN SECTION     x 2  . CHOLECYSTECTOMY     No Known Allergies No current facility-administered medications on file prior to encounter.   Current Outpatient Medications on File Prior to Encounter  Medication Sig Dispense Refill  . aspirin EC 81 MG tablet Take 81 mg by mouth daily.    Marland Kitchen atorvastatin (LIPITOR) 40 MG tablet Take 40 mg by mouth daily.     . blood glucose meter kit and supplies KIT Dispense based on patient and insurance preference.lancets to fit device. 1 each 0  . calcium-vitamin D 250-100 MG-UNIT tablet Take 1 tablet by mouth 2 (two) times daily.    Marland Kitchen glipiZIDE (GLIPIZIDE XL) 5 MG 24 hr tablet Take 1 tablet (5 mg total) by mouth daily with breakfast. 90 tablet 1  . glucose blood (CONTOUR NEXT TEST) test strip 1 each by Other route 2 (two) times daily. Use as instructed 100 each 3  . ibuprofen (ADVIL,MOTRIN) 800 MG tablet Take 800 mg by mouth every 8 (eight) hours as needed (pain).    Marland Kitchen lisinopril-hydrochlorothiazide  (PRINZIDE,ZESTORETIC) 10-12.5 MG tablet Take 1 tablet by mouth daily. 90 tablet 3  . meclizine (ANTIVERT) 25 MG tablet Take 1 tablet (25 mg total) by mouth 3 (three) times daily as needed for dizziness or nausea. 30 tablet 0  . metFORMIN (GLUCOPHAGE) 1000 MG tablet Take 1 tablet (1,000 mg total) by mouth 2 (two) times daily with a meal. 180 tablet 0  . pantoprazole (PROTONIX) 40 MG tablet Take 1 tablet (40 mg total) by mouth daily. 30 tablet 3   Social History   Socioeconomic History  . Marital status: Married    Spouse name: Not on file  . Number of children: 2  . Years of education: 11  . Highest education level: Not on file  Occupational History  . Occupation: Child care  Tobacco Use  . Smoking status: Never Smoker  . Smokeless tobacco: Never Used  Vaping Use  . Vaping Use: Never used  Substance and Sexual Activity  . Alcohol use: No  . Drug use: No  . Sexual activity: Not Currently    Partners: Male  Other Topics Concern  . Not on file  Social History Narrative   Lives at home with her husband. Married.    Right-handed.   3 cups coffee most days. Eats all food groups.    Does walking for exercise.  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    Enjoys gong to [employer] and [employer].   Has two children.    Social Determinants of Health   Financial Resource Strain:   . Difficulty of Paying Living Expenses:   Food Insecurity:   . Worried About Charity fundraiser in the Last Year:   . Arboriculturist in the Last Year:   Transportation Needs:   . Film/video editor (Medical):   Marland Kitchen Lack of Transportation (Non-Medical):   Physical Activity:   . Days of Exercise per Week:   . Minutes of Exercise per Session:   Stress:   . Feeling of Stress :   Social Connections:   . Frequency of Communication with Friends and Family:   . Frequency of Social Gatherings with Friends and Family:   . Attends Religious Services:   . Active Member of Clubs or Organizations:    . Attends Archivist Meetings:   Marland Kitchen Marital Status:   Intimate Partner Violence:   . Fear of Current or Ex-Partner:   . Emotionally Abused:   Marland Kitchen Physically Abused:   . Sexually Abused:    Family History  Problem Relation Age of Onset  . Diabetes Mother   . Heart disease Mother   . Kidney disease Mother   . Depression Mother   . Hypertension Mother   . Hyperlipidemia Mother   . Cirrhosis Father   . Alcohol abuse Father   . Diabetes Sister   . Hypertension Sister   . Hypertension Brother   . Hypertension Daughter   . Hypertension Son   . Colon cancer Neg Hx     OBJECTIVE:  Vitals:   04/19/20 1330  BP: (!) 148/97  Pulse: 84  Resp: 20  Temp: 99.1 F (37.3 C)  SpO2: 97%     General appearance: alert; appears fatigued, but nontoxic; speaking in full sentences and tolerating own secretions HEENT: NCAT; Ears: EACs clear, TMs pearly gray; Eyes: PERRL.  EOM grossly intact. Sinuses: nontender; Nose: nares patent without rhinorrhea, Throat: oropharynx clear, tonsils non erythematous or enlarged, uvula midline  Neck: supple without LAD Lungs: unlabored respirations, symmetrical air entry; cough: absent; no respiratory distress; CTAB Heart: regular rate and rhythm. Skin: warm and dry Psychological: alert and cooperative; normal mood and affect  ASSESSMENT & PLAN:  1. Flu-like symptoms   2. Encounter for screening for COVID-19   3. Suspected COVID-19 virus infection   4. Nausea without vomiting     Meds ordered this encounter  Medications  . benzonatate (TESSALON) 100 MG capsule    Sig: Take 1 capsule (100 mg total) by mouth every 8 (eight) hours.    Dispense:  21 capsule    Refill:  0    Order Specific Question:   Supervising Provider    Answer:   Raylene Everts [[id number]]  . ondansetron (ZOFRAN) 4 MG tablet    Sig: Take 1 tablet (4 mg total) by mouth every 6 (six) hours.    Dispense:  12 tablet    Refill:  0    Order Specific Question:   Supervising  Provider    Answer:   Raylene Everts [[id number]]   COVID testing ordered.  It will take between 2-5 days for test results.  Someone will contact you regarding abnormal results.    In the meantime: You should remain isolated in your home for 10 days from symptom onset AND greater than 72 hours after  symptoms resolution (absence of fever without the use of fever-reducing medication and improvement in respiratory symptoms), whichever is longer Get plenty of rest and push fluids Tessalon Perles prescribed for cough Zofran for nausea Use OTC zyrtec for nasal congestion, runny nose, and/or sore throat Use OTC flonase for nasal congestion and runny nose Use medications daily for symptom relief Use OTC medications like ibuprofen or tylenol as needed fever or pain Call or go to the ED if you have any new or worsening symptoms such as fever, worsening cough, shortness of breath, chest tightness, chest pain, turning blue, changes in mental status, etc...   Reviewed expectations re: course of current medical issues. Questions answered. Outlined signs and symptoms indicating need for more acute intervention. Patient verbalized understanding. After Visit Summary given.         Lestine Box, PA-C 04/19/20 1355

## 2020-04-20 ENCOUNTER — Ambulatory Visit (HOSPITAL_COMMUNITY): Payer: Medicare Other

## 2020-04-20 LAB — SARS-COV-2, NAA 2 DAY TAT

## 2020-04-20 LAB — NOVEL CORONAVIRUS, NAA: SARS-CoV-2, NAA: NOT DETECTED

## 2020-04-22 ENCOUNTER — Other Ambulatory Visit (HOSPITAL_COMMUNITY): Payer: Medicare Other

## 2020-04-22 ENCOUNTER — Ambulatory Visit (HOSPITAL_COMMUNITY): Payer: Medicare Other

## 2020-04-29 ENCOUNTER — Ambulatory Visit (HOSPITAL_COMMUNITY): Payer: Medicare Other

## 2020-04-29 ENCOUNTER — Inpatient Hospital Stay (HOSPITAL_COMMUNITY): Admission: RE | Admit: 2020-04-29 | Payer: Medicare Other | Source: Ambulatory Visit

## 2020-05-13 ENCOUNTER — Inpatient Hospital Stay (HOSPITAL_COMMUNITY): Admission: RE | Admit: 2020-05-13 | Payer: Medicare HMO | Source: Ambulatory Visit

## 2020-05-13 ENCOUNTER — Ambulatory Visit (HOSPITAL_COMMUNITY): Payer: Medicare HMO

## 2020-05-27 ENCOUNTER — Other Ambulatory Visit (HOSPITAL_COMMUNITY): Payer: Medicare HMO

## 2020-05-27 ENCOUNTER — Ambulatory Visit (HOSPITAL_COMMUNITY): Payer: Medicare HMO

## 2020-06-18 ENCOUNTER — Ambulatory Visit (HOSPITAL_COMMUNITY): Payer: Medicare HMO

## 2020-06-18 ENCOUNTER — Other Ambulatory Visit (HOSPITAL_COMMUNITY): Payer: Medicare HMO

## 2020-06-21 ENCOUNTER — Other Ambulatory Visit: Payer: Self-pay | Admitting: Surgery

## 2020-06-21 DIAGNOSIS — E038 Other specified hypothyroidism: Secondary | ICD-10-CM

## 2020-06-21 DIAGNOSIS — D440 Neoplasm of uncertain behavior of thyroid gland: Secondary | ICD-10-CM

## 2020-06-21 DIAGNOSIS — E042 Nontoxic multinodular goiter: Secondary | ICD-10-CM

## 2020-07-12 ENCOUNTER — Other Ambulatory Visit: Payer: Self-pay

## 2020-07-12 ENCOUNTER — Ambulatory Visit (HOSPITAL_COMMUNITY)
Admission: RE | Admit: 2020-07-12 | Discharge: 2020-07-12 | Disposition: A | Discharge status: Home / Self Care | Payer: BC Managed Care – PPO | Source: Ambulatory Visit | Attending: Family Medicine | Admitting: Family Medicine

## 2020-07-12 DIAGNOSIS — M85851 Other specified disorders of bone density and structure, right thigh: Secondary | ICD-10-CM | POA: Insufficient documentation

## 2020-07-12 DIAGNOSIS — M810 Age-related osteoporosis without current pathological fracture: Secondary | ICD-10-CM | POA: Insufficient documentation

## 2020-07-12 DIAGNOSIS — Z1231 Encounter for screening mammogram for malignant neoplasm of breast: Secondary | ICD-10-CM

## 2020-07-12 DIAGNOSIS — Z780 Asymptomatic menopausal state: Secondary | ICD-10-CM | POA: Diagnosis not present

## 2020-07-12 DIAGNOSIS — N959 Unspecified menopausal and perimenopausal disorder: Secondary | ICD-10-CM | POA: Diagnosis not present

## 2020-07-12 DIAGNOSIS — M8589 Other specified disorders of bone density and structure, multiple sites: Secondary | ICD-10-CM | POA: Diagnosis not present

## 2020-08-04 DIAGNOSIS — E782 Mixed hyperlipidemia: Secondary | ICD-10-CM | POA: Diagnosis not present

## 2020-08-04 DIAGNOSIS — Z23 Encounter for immunization: Secondary | ICD-10-CM | POA: Diagnosis not present

## 2020-08-04 DIAGNOSIS — G479 Sleep disorder, unspecified: Secondary | ICD-10-CM | POA: Diagnosis not present

## 2020-08-04 DIAGNOSIS — Z794 Long term (current) use of insulin: Secondary | ICD-10-CM | POA: Diagnosis not present

## 2020-08-04 DIAGNOSIS — I10 Essential (primary) hypertension: Secondary | ICD-10-CM | POA: Diagnosis not present

## 2020-08-04 DIAGNOSIS — M8589 Other specified disorders of bone density and structure, multiple sites: Secondary | ICD-10-CM | POA: Diagnosis not present

## 2020-08-04 DIAGNOSIS — E1169 Type 2 diabetes mellitus with other specified complication: Secondary | ICD-10-CM | POA: Diagnosis not present

## 2020-10-26 DIAGNOSIS — E119 Type 2 diabetes mellitus without complications: Secondary | ICD-10-CM | POA: Diagnosis not present

## 2020-11-13 IMAGING — US US THYROID
1 series · 13 of 25 positions shown · non-contrast
Comparison: 07/12/2017

CLINICAL DATA: Prior ultrasound follow-up. Multiple thyroid
nodules. Neoplasm of uncertain behavior.

EXAM:
THYROID ULTRASOUND
TECHNIQUE: Ultrasound examination of the thyroid gland and adjacent soft
tissues was performed.

[Series 1: us thyroid · 0.08mm/px · 43 acquisitions, 13 frames shown]
[im 1/43]
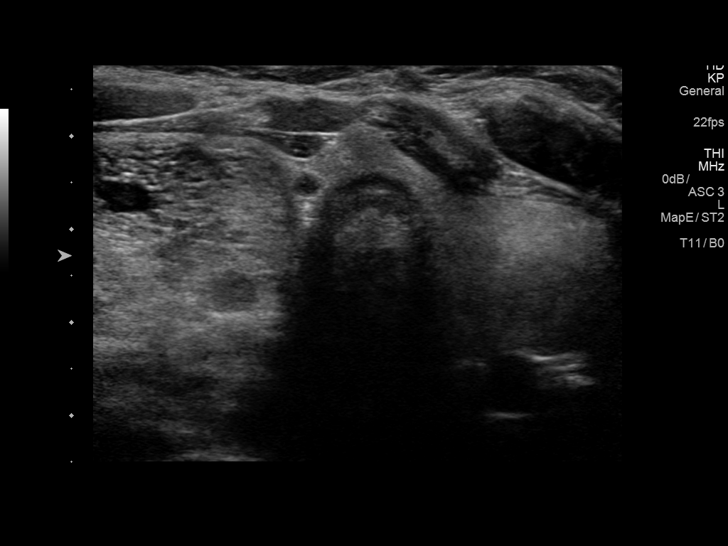
[im 4/43]
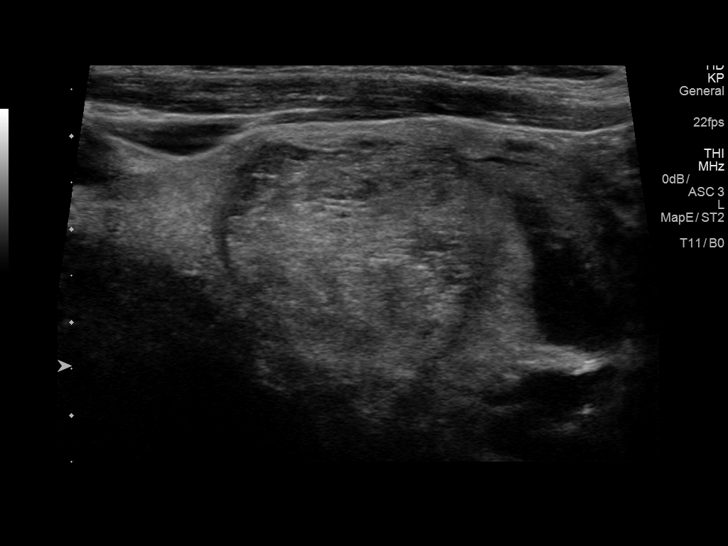
[im 8/43]
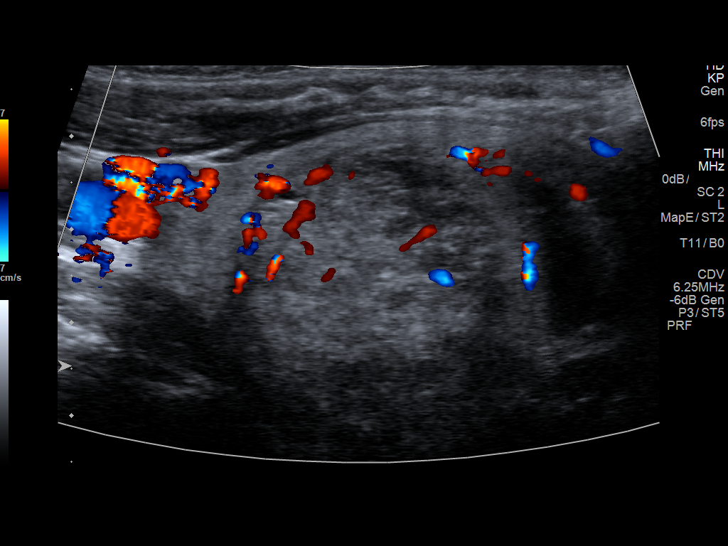
[im 11/43]
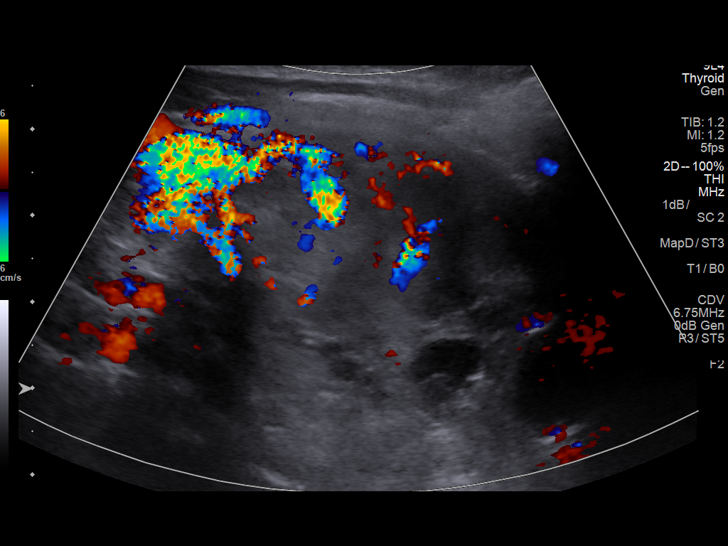
[im 15/43]
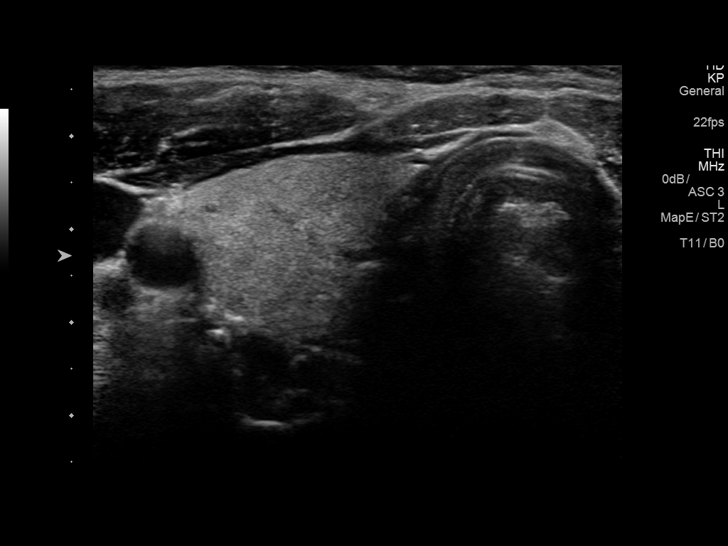
[im 18/43]
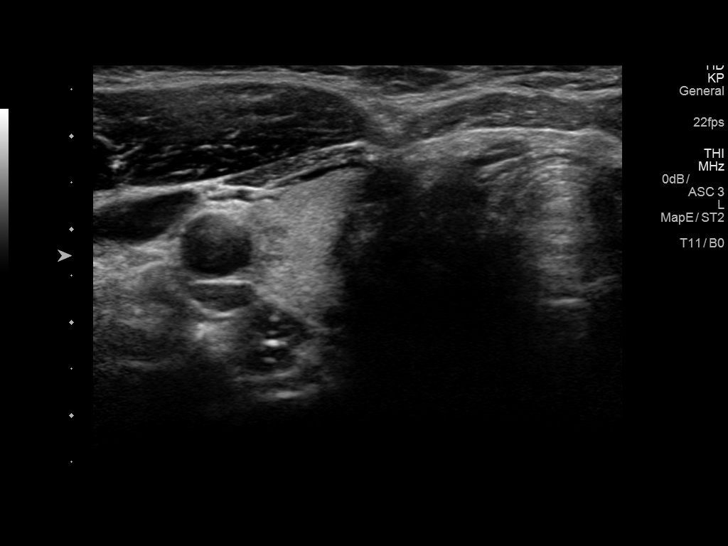
[im 22/43]
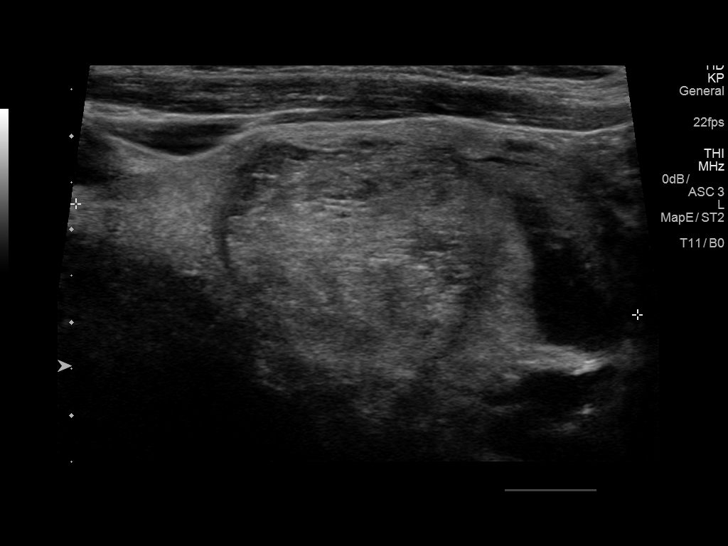
[im 25/43]
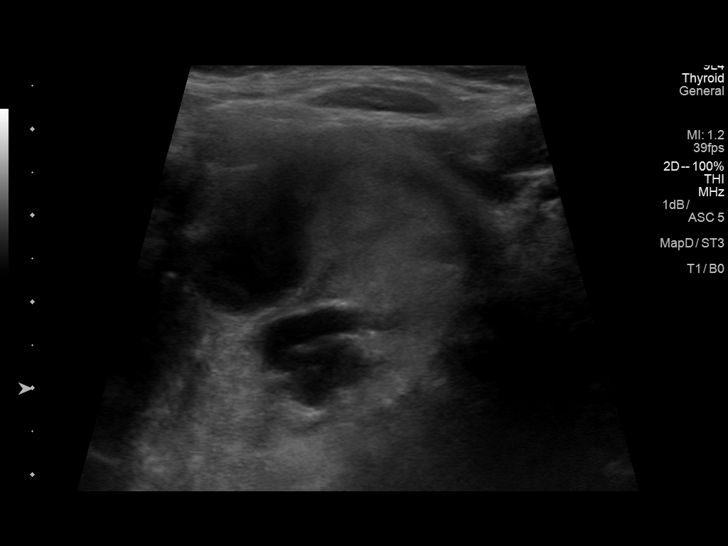
[im 29/43]
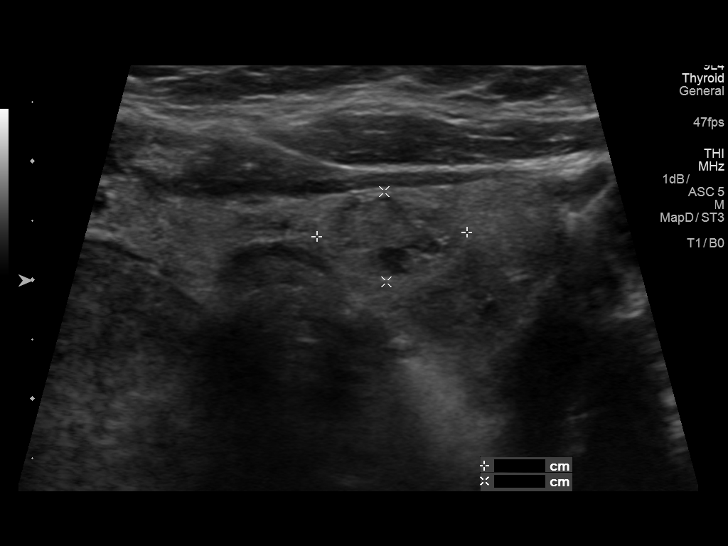
[im 32/43]
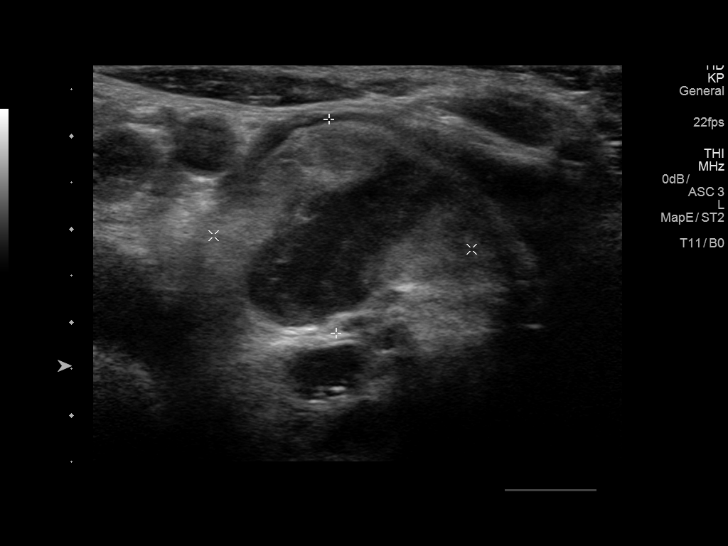
[im 36/43]
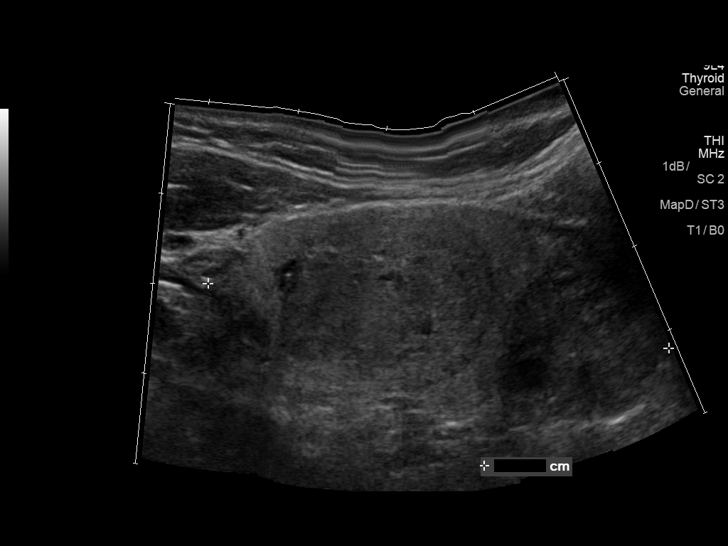
[im 39/43]
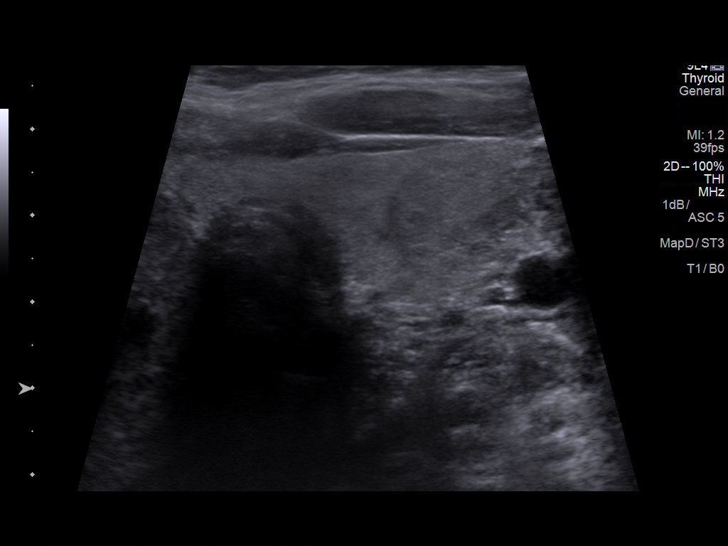
[im 43/43]
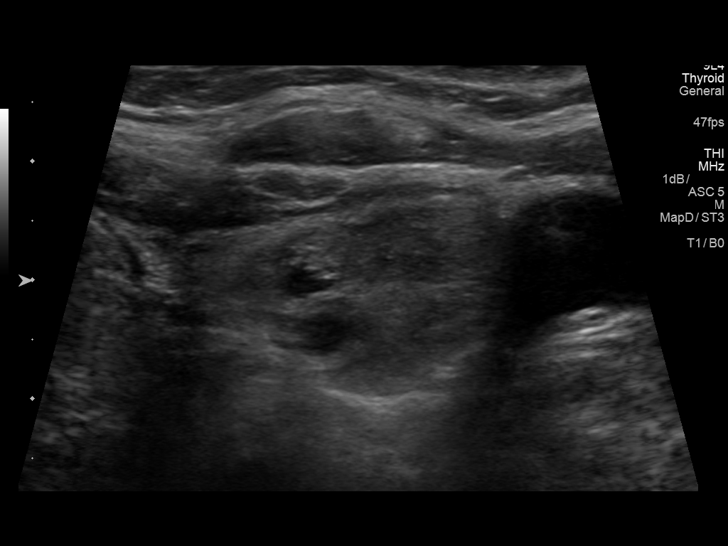

[13 of 25 positions shown; findings below may reference images not displayed]

FINDINGS: Parenchymal Echotexture: Moderately heterogenous

Isthmus: 0.6 cm, previously 0.3 cm

Right lobe: 6.1 x 3.7 x 3.5 cm, previously 5.9 x 3.0 x 3.3 cm

Left lobe: 5.1 x 1.7 x 2.7 cm, previously 5.6 x 2.3 x 2.0 cm

_________________________________________________________

Estimated total number of nodules >/= 1 cm: 5

Number of spongiform nodules >/=  2 cm not described below (TR1): 0

Number of mixed cystic and solid nodules >/= 1.5 cm not described
below (TR2): 0

_________________________________________________________

Right upper pole nodule 1 measures 3.3 x 3.1 x 3.3 cm and previously
measured 3.4 x 3.1 x 1.9 cm. Biopsy was performed previously.

Right lower pole nodule 2 measures 2.4 x 2.3 x 2.8 cm and previously
measured 3.4 x 2.4 x 2.3 cm. Biopsy was performed previously.

Right lower pole complex predominately cystic and solid nodule (non
numbered) with isoechoic characteristics measures 1.7 x 1.3 x
cm. It does not meet criteria for biopsy nor follow-up.

Left upper pole nodule 3 measures 2.5 x 1.7 x 2.0 cm and previously
measured 2.6 x 2.0 x 1.8 cm. Biopsy was performed previously.

Left mid nodule 4 measures 1.3 x 0.8 x 1.2 cm. It is isoechoic and
does not meet criteria for biopsy nor follow-up. It previously
measured 1.2 x 0.8 x 0.7 cm.

Left lower pole nodule 5 measures 2.0 x 1.7 x 1.9 cm and previously
measured 2.4 x 0.8 x 1.5 cm. Biopsy was performed previously.
IMPRESSION: Nodules 1, 2, 3, and 5 are not significantly changed and previously
underwent biopsy.

Other nodules do not meet criteria for biopsy nor follow-up.

The above is in keeping with the ACR TI-RADS recommendations - [HOSPITAL] 2258;[DATE].

## 2020-11-19 DIAGNOSIS — E1169 Type 2 diabetes mellitus with other specified complication: Secondary | ICD-10-CM | POA: Diagnosis not present

## 2020-11-19 DIAGNOSIS — I10 Essential (primary) hypertension: Secondary | ICD-10-CM | POA: Diagnosis not present

## 2020-11-19 DIAGNOSIS — M2142 Flat foot [pes planus] (acquired), left foot: Secondary | ICD-10-CM | POA: Diagnosis not present

## 2020-11-19 DIAGNOSIS — E782 Mixed hyperlipidemia: Secondary | ICD-10-CM | POA: Diagnosis not present

## 2020-11-19 DIAGNOSIS — M2141 Flat foot [pes planus] (acquired), right foot: Secondary | ICD-10-CM | POA: Diagnosis not present

## 2020-12-06 ENCOUNTER — Other Ambulatory Visit: Payer: Self-pay

## 2020-12-06 ENCOUNTER — Ambulatory Visit (INDEPENDENT_AMBULATORY_CARE_PROVIDER_SITE_OTHER): Payer: Self-pay | Admitting: Podiatry

## 2020-12-06 DIAGNOSIS — M2141 Flat foot [pes planus] (acquired), right foot: Secondary | ICD-10-CM

## 2020-12-06 DIAGNOSIS — M79674 Pain in right toe(s): Secondary | ICD-10-CM

## 2020-12-06 DIAGNOSIS — M2142 Flat foot [pes planus] (acquired), left foot: Secondary | ICD-10-CM

## 2020-12-06 DIAGNOSIS — E119 Type 2 diabetes mellitus without complications: Secondary | ICD-10-CM | POA: Diagnosis not present

## 2020-12-06 DIAGNOSIS — M79675 Pain in left toe(s): Secondary | ICD-10-CM

## 2020-12-06 DIAGNOSIS — B351 Tinea unguium: Secondary | ICD-10-CM

## 2020-12-06 NOTE — Patient Instructions (Signed)
Diabetes Mellitus and Foot Care Foot care is an important part of your health, especially when you have diabetes. Diabetes may cause you to have problems because of poor blood flow (circulation) to your feet and legs, which can cause your skin to:  Become thinner and drier.  Break more easily.  Heal more slowly.  Peel and crack. You may also have nerve damage (neuropathy) in your legs and feet, causing decreased feeling in them. This means that you may not notice minor injuries to your feet that could lead to more serious problems. Noticing and addressing any potential problems early is the best way to prevent future foot problems. How to care for your feet Foot hygiene  Wash your feet daily with warm water and mild soap. Do not use hot water. Then, pat your feet and the areas between your toes until they are completely dry. Do not soak your feet as this can dry your skin.  Trim your toenails straight across. Do not dig under them or around the cuticle. File the edges of your nails with an emery board or nail file.  Apply a moisturizing lotion or petroleum jelly to the skin on your feet and to dry, brittle toenails. Use lotion that does not contain alcohol and is unscented. Do not apply lotion between your toes.   Shoes and socks  Wear clean socks or stockings every day. Make sure they are not too tight. Do not wear knee-high stockings since they may decrease blood flow to your legs.  Wear shoes that fit properly and have enough cushioning. Always look in your shoes before you put them on to be sure there are no objects inside.  To break in new shoes, wear them for just a few hours a day. This prevents injuries on your feet. Wounds, scrapes, corns, and calluses  Check your feet daily for blisters, cuts, bruises, sores, and redness. If you cannot see the bottom of your feet, use a mirror or ask someone for help.  Do not cut corns or calluses or try to remove them with medicine.  If you  find a minor scrape, cut, or break in the skin on your feet, keep it and the skin around it clean and dry. You may clean these areas with mild soap and water. Do not clean the area with peroxide, alcohol, or iodine.  If you have a wound, scrape, corn, or callus on your foot, look at it several times a day to make sure it is healing and not infected. Check for: ? Redness, swelling, or pain. ? Fluid or blood. ? Warmth. ? Pus or a bad smell.   General tips  Do not cross your legs. This may decrease blood flow to your feet.  Do not use heating pads or hot water bottles on your feet. They may burn your skin. If you have lost feeling in your feet or legs, you may not know this is happening until it is too late.  Protect your feet from hot and cold by wearing shoes, such as at the beach or on hot pavement.  Schedule a complete foot exam at least once a year (annually) or more often if you have foot problems. Report any cuts, sores, or bruises to your health care provider immediately. Where to find more information  American Diabetes Association: www.diabetes.org  Association of Diabetes Care & Education Specialists: www.diabeteseducator.org Contact a health care provider if:  You have a medical condition that increases your risk of infection and   you have any cuts, sores, or bruises on your feet.  You have an injury that is not healing.  You have redness on your legs or feet.  You feel burning or tingling in your legs or feet.  You have pain or cramps in your legs and feet.  Your legs or feet are numb.  Your feet always feel cold.  You have pain around any toenails. Get help right away if:  You have a wound, scrape, corn, or callus on your foot and: ? You have pain, swelling, or redness that gets worse. ? You have fluid or blood coming from the wound, scrape, corn, or callus. ? Your wound, scrape, corn, or callus feels warm to the touch. ? You have pus or a bad smell coming from  the wound, scrape, corn, or callus. ? You have a fever. ? You have a red line going up your leg. Summary  Check your feet every day for blisters, cuts, bruises, sores, and redness.  Apply a moisturizing lotion or petroleum jelly to the skin on your feet and to dry, brittle toenails.  Wear shoes that fit properly and have enough cushioning.  If you have foot problems, report any cuts, sores, or bruises to your health care provider immediately.  Schedule a complete foot exam at least once a year (annually) or more often if you have foot problems. This information is not intended to replace advice given to you by your health care provider. Make sure you discuss any questions you have with your health care provider. Document Revised: 03/11/2020 Document Reviewed: 03/11/2020 Elsevier Patient Education  2021 Elsevier Inc.  

## 2020-12-08 NOTE — Progress Notes (Addendum)
  Subjective:  Patient ID: Maria Fisher, female    DOB: 23-Mar-1956,  MRN: [id number]  Chief Complaint  Patient presents with  . Nail Problem    Thick painful toenails  . Diabetes    A1C 7.0    65 y.o. female presents with the above complaint. History confirmed with patient.   Objective:  Physical Exam: warm, good capillary refill, no trophic changes or ulcerative lesions, normal DP and PT pulses, normal monofilament exam, normal sensory exam and onychomycosis x10 with thickened elongated nail plates and yellow discoloration. Assessment:   1. Type 2 diabetes mellitus without complication, without long-term current use of insulin ([place])   2. Onychomycosis   3. Pain due to onychomycosis of toenails of both feet   4. Pes planus of both feet      Plan:  Patient was evaluated and treated and all questions answered.  Patient educated on diabetes. Discussed proper diabetic foot care and discussed risks and complications of disease. Educated patient in depth on reasons to return to the office immediately should he/she discover anything concerning or new on the feet. All questions answered. Discussed proper shoes as well.   Discussed the etiology and treatment options for the condition in detail with the patient. Educated patient on the topical and oral treatment options for mycotic nails. Recommended debridement of the nails today. Sharp and mechanical debridement performed of all painful and mycotic nails today. Nails debrided in length and thickness using a nail nipper to level of comfort. Discussed treatment options including appropriate shoe gear. Follow up as needed for painful nails.    Return if symptoms worsen or fail to improve, for at risk diabetic foot care.

## 2020-12-21 ENCOUNTER — Ambulatory Visit (INDEPENDENT_AMBULATORY_CARE_PROVIDER_SITE_OTHER): Payer: Medicare Other | Admitting: Podiatry

## 2020-12-21 ENCOUNTER — Other Ambulatory Visit: Payer: Self-pay

## 2020-12-21 DIAGNOSIS — E119 Type 2 diabetes mellitus without complications: Secondary | ICD-10-CM

## 2020-12-21 NOTE — Progress Notes (Signed)
Patient presented for foam casting for 3 pair custom diabetic shoe inserts. Patient is measured with a [place] Device to be a size 8 1/2 wide  Diabetic shoes are chosen from the safe step catalog.\  The shoes chosen are 851  Patient will be contacted when the shoes and inserts are ready for pick up

## 2021-01-11 ENCOUNTER — Telehealth: Payer: Self-pay | Admitting: Podiatry

## 2021-01-11 DIAGNOSIS — J101 Influenza due to other identified influenza virus with other respiratory manifestations: Secondary | ICD-10-CM

## 2021-01-11 NOTE — Telephone Encounter (Signed)
Diabetic shoes/inserts in..lvm for pt to call to schedule an appt to pick them up. 

## 2021-01-24 ENCOUNTER — Ambulatory Visit (INDEPENDENT_AMBULATORY_CARE_PROVIDER_SITE_OTHER): Payer: Medicare Other

## 2021-01-24 ENCOUNTER — Other Ambulatory Visit: Payer: Self-pay

## 2021-01-24 ENCOUNTER — Ambulatory Visit
Admission: EM | Admit: 2021-01-24 | Discharge: 2021-01-24 | Disposition: A | Discharge status: Home / Self Care | Payer: Medicare Other | Attending: Family Medicine | Admitting: Family Medicine

## 2021-01-24 DIAGNOSIS — J209 Acute bronchitis, unspecified: Secondary | ICD-10-CM | POA: Diagnosis not present

## 2021-01-24 DIAGNOSIS — R0602 Shortness of breath: Secondary | ICD-10-CM

## 2021-01-24 DIAGNOSIS — Z1152 Encounter for screening for COVID-19: Secondary | ICD-10-CM

## 2021-01-24 DIAGNOSIS — R059 Cough, unspecified: Secondary | ICD-10-CM | POA: Diagnosis not present

## 2021-01-24 DIAGNOSIS — B349 Viral infection, unspecified: Secondary | ICD-10-CM | POA: Diagnosis not present

## 2021-01-24 DIAGNOSIS — R519 Headache, unspecified: Secondary | ICD-10-CM | POA: Diagnosis not present

## 2021-01-24 MED ORDER — GUAIFENESIN-CODEINE 100-10 MG/5ML PO SYRP: 5.0000 mL | ORAL_SOLUTION | Freq: Three times a day (TID) | ORAL | 0 refills | Status: DC | PRN

## 2021-01-24 MED ORDER — METHYLPREDNISOLONE SODIUM SUCC 125 MG IJ SOLR
125.0000 mg | Freq: Once | INTRAMUSCULAR | Status: AC
  Administered 2021-01-24: 125 mg via INTRAMUSCULAR

## 2021-01-24 MED ORDER — MOLNUPIRAVIR EUA 200MG CAPSULE: 4.0000 | ORAL_CAPSULE | Freq: Two times a day (BID) | ORAL | 0 refills | Status: AC

## 2021-01-24 MED ORDER — PREDNISONE 20 MG PO TABS: 40.0000 mg | ORAL_TABLET | Freq: Every day | ORAL | 0 refills | Status: AC

## 2021-01-24 MED ORDER — MOLNUPIRAVIR EUA 200MG CAPSULE: 4.0000 | ORAL_CAPSULE | Freq: Two times a day (BID) | ORAL | 0 refills | Status: DC

## 2021-01-24 NOTE — ED Provider Notes (Signed)
[facility]    CSN: [id number] Arrival date & time: 01/24/21  0901      History   Chief Complaint Chief Complaint  Patient presents with  . Cough  . Headache    HPI [place] Maria Fisher is a 65 y.o. female.   Reports that she has headache, cough, chills for the last few days. Has taken Nyquil with little relief. Denies sick contacts. Has positive hx Covid with infusions as treatment. Has completed Covid vaccines and booster. Has completed flu vaccine. Reports that this feels like Covid when she had it last time. Denies abdominal pain, nausea, vomiting, diarrhea, rash, other symptoms.  ROS per HPI  The history is provided by the patient.  Cough Associated symptoms: headaches   Headache Associated symptoms: cough     Past Medical History:  Diagnosis Date  . Back pain   . Diabetes mellitus without complication ([place])   . Heart murmur   . Hyperlipidemia   . Hypertension   . Neck pain   . Neuromuscular disorder ([place])   . Shoulder pain, bilateral   . Thyroid nodule     Patient Active Problem List   Diagnosis Date Noted  . Encounter for screening colonoscopy 01/07/2018  . Abnormal LFTs 01/07/2018  . Nausea 11/20/2017  . Dyspepsia 11/20/2017  . Type 2 diabetes mellitus without complication, without long-term current use of insulin ([place]) 08/01/2017  . HLD (hyperlipidemia) 07/03/2017  . Type 2 diabetes mellitus with hyperglycemia ([place]) 06/12/2017  . HTN, goal below 130/80 06/12/2017  . CTS (carpal tunnel syndrome) 02/14/2017  . Neck pain 01/22/2017  . Hand weakness 01/22/2017  . Low back pain 01/22/2017    Past Surgical History:  Procedure Laterality Date  . ABDOMINAL HYSTERECTOMY    . CESAREAN SECTION     x 2  . CHOLECYSTECTOMY      OB History    Gravida  3   Para  2   Term  2   Preterm      AB  1   Living        SAB  1   IAB      Ectopic      Multiple      Live Births               Home Medications    Prior to Admission  medications   Medication Sig Start Date End Date Taking? Authorizing Provider  guaiFENesin-codeine (ROBITUSSIN AC) 100-10 MG/5ML syrup Take 5 mLs by mouth 3 (three) times daily as needed for cough. 01/24/21  Yes Faustino Congress, NP  aspirin EC 81 MG tablet Take 81 mg by mouth daily.    [provider]  atorvastatin (LIPITOR) 40 MG tablet Take 40 mg by mouth daily.  09/14/17   [provider]  benzonatate (TESSALON) 100 MG capsule Take 1 capsule (100 mg total) by mouth every 8 (eight) hours. 04/19/20   Wurst, [place], PA-C  blood glucose meter kit and supplies KIT Dispense based on patient and insurance preference.lancets to fit device. 05/27/18   Caren Macadam, MD  calcium-vitamin D 250-100 MG-UNIT tablet Take 1 tablet by mouth 2 (two) times daily.    [provider]  glipiZIDE (GLIPIZIDE XL) 5 MG 24 hr tablet Take 1 tablet (5 mg total) by mouth daily with breakfast. 05/27/18   Caren Macadam, MD  glucose blood (CONTOUR NEXT TEST) test strip 1 each by Other route 2 (two) times daily. Use as instructed 05/27/18   Hagler,  Apolonio Schneiders, MD  ibuprofen (ADVIL,MOTRIN) 800 MG tablet Take 800 mg by mouth every 8 (eight) hours as needed (pain).    [provider]  lisinopril-hydrochlorothiazide (PRINZIDE,ZESTORETIC) 10-12.5 MG tablet Take 1 tablet by mouth daily. 04/09/18   Caren Macadam, MD  meclizine (ANTIVERT) 25 MG tablet Take 1 tablet (25 mg total) by mouth 3 (three) times daily as needed for dizziness or nausea. 06/12/18   Virgel Manifold, MD  metFORMIN (GLUCOPHAGE) 1000 MG tablet Take 1 tablet (1,000 mg total) by mouth 2 (two) times daily with a meal. 05/27/18   Caren Macadam, MD  ondansetron (ZOFRAN) 4 MG tablet Take 1 tablet (4 mg total) by mouth every 6 (six) hours. 04/19/20   Wurst, [place], PA-C  oseltamivir (TAMIFLU) 75 MG capsule Take 1 capsule (75 mg total) by mouth every 12 (twelve) hours. 01/25/21   Faustino Congress, NP  pantoprazole (PROTONIX) 40 MG tablet Take 1  tablet (40 mg total) by mouth daily. 05/27/18   Caren Macadam, MD    Family History Family History  Problem Relation Age of Onset  . Diabetes Mother   . Heart disease Mother   . Kidney disease Mother   . Depression Mother   . Hypertension Mother   . Hyperlipidemia Mother   . Cirrhosis Father   . Alcohol abuse Father   . Diabetes Sister   . Hypertension Sister   . Hypertension Brother   . Hypertension Daughter   . Hypertension Son   . Colon cancer Neg Hx     Social History Social History   Tobacco Use  . Smoking status: Never Smoker  . Smokeless tobacco: Never Used  Vaping Use  . Vaping Use: Never used  Substance Use Topics  . Alcohol use: No  . Drug use: No     Allergies   Patient has no known allergies.   Review of Systems Review of Systems  Respiratory: Positive for cough.   Neurological: Positive for headaches.     Physical Exam Triage Vital Signs ED Triage Vitals [01/24/21 0957]  Enc Vitals Group     BP (!) 150/84     Pulse Rate 76     Resp 17     Temp 99.1 F (37.3 C)     Temp Source Tympanic     SpO2 93 %     Weight      Height      Head Circumference      Peak Flow      Pain Score      Pain Loc      Pain Edu?      Excl. in [place]?    No data found.  Updated Vital Signs BP (!) 150/84 (BP Location: Right Arm)   Pulse 76   Temp 99.1 F (37.3 C) (Tympanic)   Resp 17   SpO2 93%   Visual Acuity Right Eye Distance:   Left Eye Distance:   Bilateral Distance:    Right Eye Near:   Left Eye Near:    Bilateral Near:     Physical Exam Vitals and nursing note reviewed.  Constitutional:      General: She is not in acute distress.    Appearance: She is well-developed. She is ill-appearing.  HENT:     Head: Normocephalic and atraumatic.     Right Ear: Tympanic membrane, ear canal and external ear normal.     Left Ear: Tympanic membrane, ear canal and external ear normal.     Nose: Congestion present.  Mouth/Throat:     Mouth:  Mucous membranes are moist.     Pharynx: Posterior oropharyngeal erythema present.  Eyes:     Extraocular Movements: Extraocular movements intact.     Conjunctiva/sclera: Conjunctivae normal.     Pupils: Pupils are equal, round, and reactive to light.  Cardiovascular:     Rate and Rhythm: Normal rate and regular rhythm.     Heart sounds: Normal heart sounds. No murmur heard.   Pulmonary:     Effort: Pulmonary effort is normal. No respiratory distress.     Breath sounds: No stridor. Wheezing present. No rhonchi or rales.     Comments: Diminished breath sounds to bilateral lower lobes Chest:     Chest wall: No tenderness.  Abdominal:     General: There is no distension.     Palpations: Abdomen is soft. There is no mass.     Tenderness: There is no abdominal tenderness. There is no right CVA tenderness, left CVA tenderness, guarding or rebound.     Hernia: No hernia is present.  Musculoskeletal:        General: Normal range of motion.     Cervical back: Normal range of motion and neck supple.  Lymphadenopathy:     Cervical: Cervical adenopathy present.  Skin:    General: Skin is warm and dry.     Capillary Refill: Capillary refill takes less than 2 seconds.  Neurological:     General: No focal deficit present.     Mental Status: She is alert and oriented to person, place, and time.  Psychiatric:        Mood and Affect: Mood normal.        Behavior: Behavior normal.        Thought Content: Thought content normal.      UC Treatments / Results  Labs (all labs ordered are listed, but only abnormal results are displayed) Labs Reviewed  COVID-19, FLU A+B NAA - Abnormal; Notable for the following components:      Result Value   Influenza A, NAA Detected (*)    All other components within normal limits   Narrative:    Test(s) 140142-Influenza A, NAA; 140143-Influenza B, NAA was developed and its performance characteristics determined by [facility]. It has not been cleared or  approved by the Food and Drug Administration. Performed at:  [address], [place], [place]  [id number] Lab Director: Rush Farmer MD, Phone:  [phone]    EKG   Radiology No results found.  Procedures Procedures (including critical care time)  Medications Ordered in UC Medications  methylPREDNISolone sodium succinate (SOLU-MEDROL) 125 mg/2 mL injection 125 mg (125 mg Intramuscular Given 01/24/21 1124)    Initial Impression / Assessment and Plan / UC Course  I have reviewed the triage vital signs and the nursing notes.  Pertinent labs & imaging results that were available during my care of the patient were reviewed by me and considered in my medical decision making (see chart for details).    Viral Illness SOB Acute Bronchitis  Solumedrol 154m IM in office today Prescribed steroid taper Prescribed cheratussin Sedation precautions given Prescribed molnupiravir in case of positive Covid testing If flu is positive and Covid is negative, do not fill this medication Will send in tamiflu if flu is positive Covid swab obtained in office today.   Patient instructed to quarantine until results are back and negative.   If results are negative, patient may resume daily schedule as tolerated once  they are fever free for 24 hours without the use of antipyretic medications.   If results are positive, patient instructed to quarantine for at least 5 days from symptom onset.  If after 5 days symptoms have resolved, may return to work with a well fitting mask for the next 5 days. If symptomatic after day 5, isolation should be extended to 10 days. Patient instructed to follow-up with primary care or with this office as needed.   Patient instructed to follow-up in the ER for trouble swallowing, trouble breathing, other concerning symptoms.   Final Clinical Impressions(s) / UC Diagnoses   Final diagnoses:  Viral illness  Acute bronchitis, unspecified  organism  SOB (shortness of breath)     Discharge Instructions     Chest xray today negative for pneumonia  You have received a steroid in the office today to help open you up  I have sent in prednisone for you to take 2 tablets daily in the morning for 5 days.  I have sent in molnupiravir for you to take 4 capsules by mouth twice a day for 5 days  I have sent in cough syrup for you to take. This medication can make you sleepy. Do not drive while taking this medication.  Your COVID and Influenza tests are pending.  You should self quarantine until the test results are back.    Take Tylenol or ibuprofen as needed for fever or discomfort.  Rest and keep yourself hydrated.    Follow-up with your primary care provider if your symptoms are not improving.        ED Prescriptions    Medication Sig Dispense Auth. Provider   predniSONE (DELTASONE) 20 MG tablet Take 2 tablets (40 mg total) by mouth daily with breakfast for 5 days. 10 tablet Faustino Congress, NP   guaiFENesin-codeine (ROBITUSSIN AC) 100-10 MG/5ML syrup Take 5 mLs by mouth 3 (three) times daily as needed for cough. 120 mL Faustino Congress, NP   molnupiravir EUA 200 mg CAPS  (Status: Discontinued) Take 4 capsules (800 mg total) by mouth 2 (two) times daily for 5 days. 40 capsule Faustino Congress, NP   molnupiravir EUA 200 mg CAPS Take 4 capsules (800 mg total) by mouth 2 (two) times daily for 5 days. 40 capsule Faustino Congress, NP     PDMP not reviewed this encounter.   Faustino Congress, NP 02/05/21 1850

## 2021-01-24 NOTE — Discharge Instructions (Addendum)
Chest xray today negative for pneumonia  You have received a steroid in the office today to help open you up  I have sent in prednisone for you to take 2 tablets daily in the morning for 5 days.  I have sent in molnupiravir for you to take 4 capsules by mouth twice a day for 5 days  I have sent in cough syrup for you to take. This medication can make you sleepy. Do not drive while taking this medication.  Your COVID and Influenza tests are pending.  You should self quarantine until the test results are back.    Take Tylenol or ibuprofen as needed for fever or discomfort.  Rest and keep yourself hydrated.    Follow-up with your primary care provider if your symptoms are not improving.

## 2021-01-24 NOTE — ED Triage Notes (Signed)
Pt presents with headache cough and chills for past couple of days.

## 2021-01-25 LAB — COVID-19, FLU A+B NAA
Influenza A, NAA: DETECTED — AB
Influenza B, NAA: NOT DETECTED
SARS-CoV-2, NAA: NOT DETECTED

## 2021-01-25 MED ORDER — OSELTAMIVIR PHOSPHATE 75 MG PO CAPS: 75.0000 mg | ORAL_CAPSULE | Freq: Two times a day (BID) | ORAL | 0 refills | Status: DC

## 2021-02-02 ENCOUNTER — Telehealth: Payer: Self-pay | Admitting: Podiatry

## 2021-02-02 NOTE — Telephone Encounter (Signed)
Pt left message checking on status of diabetic shoes for her and her husband.   I returned call and left message that I had left message for her previously to call to schedule an appt to pick the shoes up as they are in. And as far as her husbands they are not in yet and I would call when they come in.

## 2021-02-07 ENCOUNTER — Other Ambulatory Visit: Payer: Self-pay

## 2021-02-07 ENCOUNTER — Ambulatory Visit (INDEPENDENT_AMBULATORY_CARE_PROVIDER_SITE_OTHER): Payer: Medicare Other | Admitting: Podiatry

## 2021-02-07 DIAGNOSIS — M2142 Flat foot [pes planus] (acquired), left foot: Secondary | ICD-10-CM | POA: Diagnosis not present

## 2021-02-07 DIAGNOSIS — M2141 Flat foot [pes planus] (acquired), right foot: Secondary | ICD-10-CM | POA: Diagnosis not present

## 2021-02-07 DIAGNOSIS — E119 Type 2 diabetes mellitus without complications: Secondary | ICD-10-CM | POA: Diagnosis not present

## 2021-02-07 NOTE — Progress Notes (Signed)
The patient presented to the office today to pick up diabetic shoes and 3 pair diabetic custom inserts.  1 pair of inserts were put in the shoes and the shoes were fitted to the patient. The patient states they are comfortable and free of defect. She is satisfied with the fit of the shoe. Instructions for break in and wear were dispensed. The patient signed the delivery documentation and break in instruction form.  If any concerns or questions arise, she is instructed to call the office

## 2021-06-30 DIAGNOSIS — E1169 Type 2 diabetes mellitus with other specified complication: Secondary | ICD-10-CM | POA: Diagnosis not present

## 2021-06-30 DIAGNOSIS — E0590 Thyrotoxicosis, unspecified without thyrotoxic crisis or storm: Secondary | ICD-10-CM | POA: Diagnosis not present

## 2021-06-30 DIAGNOSIS — E559 Vitamin D deficiency, unspecified: Secondary | ICD-10-CM | POA: Diagnosis not present

## 2021-06-30 DIAGNOSIS — Z79899 Other long term (current) drug therapy: Secondary | ICD-10-CM | POA: Diagnosis not present

## 2021-06-30 DIAGNOSIS — G479 Sleep disorder, unspecified: Secondary | ICD-10-CM | POA: Diagnosis not present

## 2021-06-30 DIAGNOSIS — E782 Mixed hyperlipidemia: Secondary | ICD-10-CM | POA: Diagnosis not present

## 2021-06-30 DIAGNOSIS — Z23 Encounter for immunization: Secondary | ICD-10-CM | POA: Diagnosis not present

## 2021-06-30 DIAGNOSIS — I10 Essential (primary) hypertension: Secondary | ICD-10-CM | POA: Diagnosis not present

## 2021-08-03 DIAGNOSIS — R7401 Elevation of levels of liver transaminase levels: Secondary | ICD-10-CM | POA: Diagnosis not present

## 2021-08-03 DIAGNOSIS — E1169 Type 2 diabetes mellitus with other specified complication: Secondary | ICD-10-CM | POA: Diagnosis not present

## 2021-08-03 DIAGNOSIS — I10 Essential (primary) hypertension: Secondary | ICD-10-CM | POA: Diagnosis not present

## 2021-08-09 DIAGNOSIS — R7401 Elevation of levels of liver transaminase levels: Secondary | ICD-10-CM | POA: Diagnosis not present

## 2021-08-11 ENCOUNTER — Other Ambulatory Visit: Payer: Self-pay | Admitting: Family Medicine

## 2021-08-11 DIAGNOSIS — R7401 Elevation of levels of liver transaminase levels: Secondary | ICD-10-CM

## 2021-08-15 ENCOUNTER — Other Ambulatory Visit (HOSPITAL_COMMUNITY): Payer: Self-pay | Admitting: Family Medicine

## 2021-08-15 DIAGNOSIS — R7401 Elevation of levels of liver transaminase levels: Secondary | ICD-10-CM

## 2021-08-24 DIAGNOSIS — K219 Gastro-esophageal reflux disease without esophagitis: Secondary | ICD-10-CM | POA: Diagnosis not present

## 2021-08-24 DIAGNOSIS — I10 Essential (primary) hypertension: Secondary | ICD-10-CM | POA: Diagnosis not present

## 2021-08-24 DIAGNOSIS — E782 Mixed hyperlipidemia: Secondary | ICD-10-CM | POA: Diagnosis not present

## 2021-08-24 DIAGNOSIS — E1169 Type 2 diabetes mellitus with other specified complication: Secondary | ICD-10-CM | POA: Diagnosis not present

## 2021-09-02 ENCOUNTER — Ambulatory Visit (HOSPITAL_COMMUNITY)
Admission: RE | Admit: 2021-09-02 | Discharge: 2021-09-02 | Disposition: A | Discharge status: Home / Self Care | Payer: Medicare Other | Source: Ambulatory Visit | Attending: Family Medicine | Admitting: Family Medicine

## 2021-09-02 ENCOUNTER — Other Ambulatory Visit: Payer: Self-pay

## 2021-09-02 DIAGNOSIS — R7401 Elevation of levels of liver transaminase levels: Secondary | ICD-10-CM | POA: Insufficient documentation

## 2021-09-02 DIAGNOSIS — N200 Calculus of kidney: Secondary | ICD-10-CM | POA: Diagnosis not present

## 2021-09-02 DIAGNOSIS — K7689 Other specified diseases of liver: Secondary | ICD-10-CM | POA: Diagnosis not present

## 2021-09-26 DIAGNOSIS — I10 Essential (primary) hypertension: Secondary | ICD-10-CM | POA: Diagnosis not present

## 2021-09-26 DIAGNOSIS — K219 Gastro-esophageal reflux disease without esophagitis: Secondary | ICD-10-CM | POA: Diagnosis not present

## 2021-09-26 DIAGNOSIS — E1169 Type 2 diabetes mellitus with other specified complication: Secondary | ICD-10-CM | POA: Diagnosis not present

## 2021-09-26 DIAGNOSIS — E782 Mixed hyperlipidemia: Secondary | ICD-10-CM | POA: Diagnosis not present

## 2021-11-12 ENCOUNTER — Ambulatory Visit
Admission: EM | Admit: 2021-11-12 | Discharge: 2021-11-12 | Disposition: A | Discharge status: Home / Self Care | Payer: Medicare Other

## 2021-11-12 ENCOUNTER — Other Ambulatory Visit: Payer: Self-pay

## 2021-11-12 DIAGNOSIS — J069 Acute upper respiratory infection, unspecified: Secondary | ICD-10-CM

## 2021-11-12 DIAGNOSIS — Z20828 Contact with and (suspected) exposure to other viral communicable diseases: Secondary | ICD-10-CM

## 2021-11-12 MED ORDER — MOLNUPIRAVIR EUA 200MG CAPSULE: 4.0000 | ORAL_CAPSULE | Freq: Two times a day (BID) | ORAL | 0 refills | Status: AC

## 2021-11-12 MED ORDER — PROMETHAZINE-DM 6.25-15 MG/5ML PO SYRP: 5.0000 mL | ORAL_SOLUTION | Freq: Four times a day (QID) | ORAL | 0 refills | Status: DC | PRN

## 2021-11-12 MED ORDER — LIDOCAINE VISCOUS HCL 2 % MT SOLN: 10.0000 mL | OROMUCOSAL | 0 refills | Status: DC | PRN

## 2021-11-12 NOTE — ED Triage Notes (Signed)
Pt states she is experiencing cough, sore throat, body aches, fatigue, chills and sweats that all started yesterday ? ?Pt states she has only been using cough drops ?

## 2021-11-12 NOTE — ED Provider Notes (Signed)
[facility]    CSN: [id number] Arrival date & time: 11/12/21  0917      History   Chief Complaint Chief Complaint  Patient presents with   Cough    Chills, headache, body aches, sore throat an cough    HPI Maria Fisher is a 66 y.o. female.   Presenting today with sudden onset fever, chills, body aches, fatigue, sweats, sore throat, cough, congestion since yesterday.  Denies chest pain, shortness of breath, abdominal pain, nausea vomiting or diarrhea.  Has been trying cough drops with no benefit.  States her husband had similar symptoms last week but is now resolved from it, did not do any testing last week.  No known history of chronic pulmonary disease.   Past Medical History:  Diagnosis Date   Back pain    Diabetes mellitus without complication ([place])    Heart murmur    Hyperlipidemia    Hypertension    Neck pain    Neuromuscular disorder (HCC)    Shoulder pain, bilateral    Thyroid nodule    Patient Active Problem List   Diagnosis Date Noted   Encounter for screening colonoscopy 01/07/2018   Abnormal LFTs 01/07/2018   Nausea 11/20/2017   Dyspepsia 11/20/2017   Type 2 diabetes mellitus without complication, without long-term current use of insulin ([place]) 08/01/2017   HLD (hyperlipidemia) 07/03/2017   Type 2 diabetes mellitus with hyperglycemia ([place]) 06/12/2017   HTN, goal below 130/80 06/12/2017   CTS (carpal tunnel syndrome) 02/14/2017   Neck pain 01/22/2017   Hand weakness 01/22/2017   Low back pain 01/22/2017    Past Surgical History:  Procedure Laterality Date   ABDOMINAL HYSTERECTOMY     CESAREAN SECTION     x 2   CHOLECYSTECTOMY     OB History     Gravida  3   Para  2   Term  2   Preterm      AB  1   Living         SAB  1   IAB      Ectopic      Multiple      Live Births              Home Medications    Prior to Admission medications   Medication Sig Start Date End Date Taking? Authorizing Provider   lidocaine (XYLOCAINE) 2 % solution Use as directed 10 mLs in the mouth or throat every 3 (three) hours as needed for mouth pain. 11/12/21  Yes Volney American, PA-C  molnupiravir EUA (LAGEVRIO) 200 mg CAPS capsule Take 4 capsules (800 mg total) by mouth 2 (two) times daily for 5 days. 11/12/21 11/17/21 Yes Volney American, PA-C  promethazine-dextromethorphan (PROMETHAZINE-DM) 6.25-15 MG/5ML syrup Take 5 mLs by mouth 4 (four) times daily as needed. 11/12/21  Yes Volney American, PA-C  aspirin EC 81 MG tablet Take 81 mg by mouth daily.    [provider]  atorvastatin (LIPITOR) 40 MG tablet Take 40 mg by mouth daily.  09/14/17   [provider]  benzonatate (TESSALON) 100 MG capsule Take 1 capsule (100 mg total) by mouth every 8 (eight) hours. 04/19/20   Wurst, [place], PA-C  blood glucose meter kit and supplies KIT Dispense based on patient and insurance preference.lancets to fit device. 05/27/18   Caren Macadam, MD  calcium-vitamin D 250-100 MG-UNIT tablet Take 1 tablet by mouth 2 (two) times daily.    [provider]  glipiZIDE (GLIPIZIDE XL) 5 MG 24 hr tablet Take 1 tablet (5 mg total) by mouth daily with breakfast. 05/27/18   Caren Macadam, MD  glucose blood (CONTOUR NEXT TEST) test strip 1 each by Other route 2 (two) times daily. Use as instructed 05/27/18   Caren Macadam, MD  guaiFENesin-codeine [facility]) 100-10 MG/5ML syrup Take 5 mLs by mouth 3 (three) times daily as needed for cough. 01/24/21   Faustino Congress, NP  ibuprofen (ADVIL,MOTRIN) 800 MG tablet Take 800 mg by mouth every 8 (eight) hours as needed (pain).    [provider]  lisinopril-hydrochlorothiazide (PRINZIDE,ZESTORETIC) 10-12.5 MG tablet Take 1 tablet by mouth daily. 04/09/18   Caren Macadam, MD  meclizine (ANTIVERT) 25 MG tablet Take 1 tablet (25 mg total) by mouth 3 (three) times daily as needed for dizziness or nausea. 06/12/18   Virgel Manifold, MD  metFORMIN  (GLUCOPHAGE) 1000 MG tablet Take 1 tablet (1,000 mg total) by mouth 2 (two) times daily with a meal. 05/27/18   Caren Macadam, MD  ondansetron (ZOFRAN) 4 MG tablet Take 1 tablet (4 mg total) by mouth every 6 (six) hours. 04/19/20   Wurst, [place], PA-C  oseltamivir (TAMIFLU) 75 MG capsule Take 1 capsule (75 mg total) by mouth every 12 (twelve) hours. 01/25/21   Faustino Congress, NP  OZEMPIC, 0.25 OR 0.5 MG/DOSE, 2 MG/1.5ML SOPN Inject 0.5 mg into the skin once a week. 10/19/21   [provider]  pantoprazole (PROTONIX) 40 MG tablet Take 1 tablet (40 mg total) by mouth daily. 05/27/18   Caren Macadam, MD   Family History Family History  Problem Relation Age of Onset   Diabetes Mother    Heart disease Mother    Kidney disease Mother    Depression Mother    Hypertension Mother    Hyperlipidemia Mother    Cirrhosis Father    Alcohol abuse Father    Diabetes Sister    Hypertension Sister    Hypertension Brother    Hypertension Daughter    Hypertension Son    Colon cancer Neg Hx    Social History Social History   Tobacco Use   Smoking status: Never   Smokeless tobacco: Never  Vaping Use   Vaping Use: Never used  Substance Use Topics   Alcohol use: No   Drug use: No     Allergies   Patient has no known allergies.   Review of Systems Review of Systems Per HPI  Physical Exam Triage Vital Signs ED Triage Vitals  Enc Vitals Group     BP 11/12/21 1034 (!) 145/88     Pulse Rate 11/12/21 1034 83     Resp 11/12/21 1034 16     Temp 11/12/21 1034 99.1 F (37.3 C)     Temp Source 11/12/21 1034 Oral     SpO2 11/12/21 1034 99 %     Weight --      Height --      Head Circumference --      Peak Flow --      Pain Score 11/12/21 1030 7     Pain Loc --      Pain Edu? --      Excl. in [place]? --    No data found.  Updated Vital Signs BP (!) 145/88 (BP Location: Right Arm)    Pulse 83    Temp 99.1 F (37.3 C) (Oral)    Resp 16    SpO2 99%   Visual Acuity  Right Eye  Distance:   Left Eye Distance:   Bilateral Distance:    Right Eye Near:   Left Eye Near:    Bilateral Near:     Physical Exam Vitals and nursing note reviewed.  Constitutional:      Appearance: Normal appearance. She is not ill-appearing.  HENT:     Head: Atraumatic.     Nose: Rhinorrhea present.     Mouth/Throat:     Mouth: Mucous membranes are moist.     Pharynx: Oropharynx is clear. Posterior oropharyngeal erythema present.  Eyes:     Extraocular Movements: Extraocular movements intact.     Conjunctiva/sclera: Conjunctivae normal.  Cardiovascular:     Rate and Rhythm: Normal rate and regular rhythm.     Heart sounds: Normal heart sounds.  Pulmonary:     Effort: Pulmonary effort is normal.     Breath sounds: Normal breath sounds. No wheezing or rales.  Musculoskeletal:        General: Normal range of motion.     Cervical back: Normal range of motion and neck supple.  Skin:    General: Skin is warm and dry.  Neurological:     Mental Status: She is alert and oriented to person, place, and time.  Psychiatric:        Mood and Affect: Mood normal.        Thought Content: Thought content normal.        Judgment: Judgment normal.     UC Treatments / Results  Labs (all labs ordered are listed, but only abnormal results are displayed) Labs Reviewed  COVID-19, FLU A+B NAA    EKG   Radiology No results found.  Procedures Procedures (including critical care time)  Medications Ordered in UC Medications - No data to display  Initial Impression / Assessment and Plan / UC Course  I have reviewed the triage vital signs and the nursing notes.  Pertinent labs & imaging results that were available during my care of the patient were reviewed by me and considered in my medical decision making (see chart for details).     Suspect viral upper respiratory infection, likely COVID-19.  COVID and flu testing pending, in the meantime we will start molnupiravir and Phenergan  DM.  May discontinue molnupiravir if results negative.  Discussed supportive over-the-counter medications and home care additionally.  Return for any acutely worsening symptoms.  Final Clinical Impressions(s) / UC Diagnoses   Final diagnoses:  Exposure to the flu  Viral URI with cough   Discharge Instructions   None    ED Prescriptions     Medication Sig Dispense Auth. Provider   molnupiravir EUA (LAGEVRIO) 200 mg CAPS capsule Take 4 capsules (800 mg total) by mouth 2 (two) times daily for 5 days. 40 capsule Volney American, [place]   promethazine-dextromethorphan (PROMETHAZINE-DM) 6.25-15 MG/5ML syrup Take 5 mLs by mouth 4 (four) times daily as needed. 100 mL Volney American, PA-C   lidocaine (XYLOCAINE) 2 % solution Use as directed 10 mLs in the mouth or throat every 3 (three) hours as needed for mouth pain. 100 mL Volney American, [place]      PDMP not reviewed this encounter.   Volney American, [place] 11/12/21 1122

## 2021-11-13 LAB — COVID-19, FLU A+B NAA
Influenza A, NAA: NOT DETECTED
Influenza B, NAA: NOT DETECTED
SARS-CoV-2, NAA: NOT DETECTED

## 2021-12-06 ENCOUNTER — Other Ambulatory Visit: Payer: Self-pay | Admitting: Gastroenterology

## 2021-12-06 DIAGNOSIS — K7460 Unspecified cirrhosis of liver: Secondary | ICD-10-CM | POA: Diagnosis not present

## 2021-12-07 DIAGNOSIS — E782 Mixed hyperlipidemia: Secondary | ICD-10-CM | POA: Diagnosis not present

## 2021-12-07 DIAGNOSIS — K219 Gastro-esophageal reflux disease without esophagitis: Secondary | ICD-10-CM | POA: Diagnosis not present

## 2021-12-07 DIAGNOSIS — E1169 Type 2 diabetes mellitus with other specified complication: Secondary | ICD-10-CM | POA: Diagnosis not present

## 2021-12-07 DIAGNOSIS — I10 Essential (primary) hypertension: Secondary | ICD-10-CM | POA: Diagnosis not present

## 2021-12-12 ENCOUNTER — Other Ambulatory Visit: Payer: Self-pay | Admitting: Gastroenterology

## 2021-12-12 ENCOUNTER — Other Ambulatory Visit (HOSPITAL_COMMUNITY): Payer: Self-pay | Admitting: Gastroenterology

## 2021-12-12 DIAGNOSIS — K7460 Unspecified cirrhosis of liver: Secondary | ICD-10-CM

## 2021-12-20 DIAGNOSIS — E1169 Type 2 diabetes mellitus with other specified complication: Secondary | ICD-10-CM | POA: Diagnosis not present

## 2021-12-20 DIAGNOSIS — E0590 Thyrotoxicosis, unspecified without thyrotoxic crisis or storm: Secondary | ICD-10-CM | POA: Diagnosis not present

## 2021-12-20 DIAGNOSIS — E782 Mixed hyperlipidemia: Secondary | ICD-10-CM | POA: Diagnosis not present

## 2021-12-20 DIAGNOSIS — Z1231 Encounter for screening mammogram for malignant neoplasm of breast: Secondary | ICD-10-CM | POA: Diagnosis not present

## 2021-12-20 DIAGNOSIS — Z0000 Encounter for general adult medical examination without abnormal findings: Secondary | ICD-10-CM | POA: Diagnosis not present

## 2021-12-20 DIAGNOSIS — K7460 Unspecified cirrhosis of liver: Secondary | ICD-10-CM | POA: Diagnosis not present

## 2021-12-20 DIAGNOSIS — G479 Sleep disorder, unspecified: Secondary | ICD-10-CM | POA: Diagnosis not present

## 2021-12-20 DIAGNOSIS — Z23 Encounter for immunization: Secondary | ICD-10-CM | POA: Diagnosis not present

## 2021-12-20 DIAGNOSIS — I10 Essential (primary) hypertension: Secondary | ICD-10-CM | POA: Diagnosis not present

## 2021-12-23 ENCOUNTER — Other Ambulatory Visit: Payer: Self-pay | Admitting: Student

## 2021-12-26 ENCOUNTER — Ambulatory Visit (HOSPITAL_COMMUNITY)
Admission: RE | Admit: 2021-12-26 | Discharge: 2021-12-26 | Disposition: A | Discharge status: Home / Self Care | Payer: Medicare Other | Source: Ambulatory Visit | Attending: Gastroenterology | Admitting: Gastroenterology

## 2021-12-26 ENCOUNTER — Other Ambulatory Visit: Payer: Self-pay

## 2021-12-26 ENCOUNTER — Other Ambulatory Visit (HOSPITAL_COMMUNITY): Payer: Self-pay | Admitting: Family Medicine

## 2021-12-26 ENCOUNTER — Encounter (HOSPITAL_COMMUNITY): Payer: Self-pay

## 2021-12-26 DIAGNOSIS — Z7984 Long term (current) use of oral hypoglycemic drugs: Secondary | ICD-10-CM | POA: Insufficient documentation

## 2021-12-26 DIAGNOSIS — E119 Type 2 diabetes mellitus without complications: Secondary | ICD-10-CM | POA: Diagnosis not present

## 2021-12-26 DIAGNOSIS — R7989 Other specified abnormal findings of blood chemistry: Secondary | ICD-10-CM | POA: Insufficient documentation

## 2021-12-26 DIAGNOSIS — Z1231 Encounter for screening mammogram for malignant neoplasm of breast: Secondary | ICD-10-CM

## 2021-12-26 DIAGNOSIS — K7460 Unspecified cirrhosis of liver: Secondary | ICD-10-CM | POA: Insufficient documentation

## 2021-12-26 DIAGNOSIS — E785 Hyperlipidemia, unspecified: Secondary | ICD-10-CM | POA: Insufficient documentation

## 2021-12-26 DIAGNOSIS — I10 Essential (primary) hypertension: Secondary | ICD-10-CM | POA: Insufficient documentation

## 2021-12-26 DIAGNOSIS — K759 Inflammatory liver disease, unspecified: Secondary | ICD-10-CM | POA: Diagnosis not present

## 2021-12-26 LAB — GLUCOSE, CAPILLARY
Glucose-Capillary: 74 mg/dL (ref 70–99)
Glucose-Capillary: 60 mg/dL — ABNORMAL LOW (ref 70–99)
Glucose-Capillary: 95 mg/dL (ref 70–99)
Glucose-Capillary: 77 mg/dL (ref 70–99)

## 2021-12-26 LAB — CBC
HCT: 37.8 % (ref 36.0–46.0)
Hemoglobin: 12.3 g/dL (ref 12.0–15.0)
MCH: 28 pg (ref 26.0–34.0)
MCHC: 32.5 g/dL (ref 30.0–36.0)
MCV: 86.1 fL (ref 80.0–100.0)
Platelets: 231 10*3/uL (ref 150–400)
RBC: 4.39 MIL/uL (ref 3.87–5.11)
RDW: 13 % (ref 11.5–15.5)
WBC: 9.2 10*3/uL (ref 4.0–10.5)
nRBC: 0 % (ref 0.0–0.2)

## 2021-12-26 LAB — PROTIME-INR
INR: 1 (ref 0.8–1.2)
Prothrombin Time: 12.7 seconds (ref 11.4–15.2)

## 2021-12-26 MED ORDER — DEXTROSE 50 % IV SOLN
25.0000 mL | Freq: Once | INTRAVENOUS | Status: AC
Start: 2021-12-26 — End: 2021-12-26

## 2021-12-26 MED ORDER — FENTANYL CITRATE (PF) 100 MCG/2ML IJ SOLN
INTRAMUSCULAR | Status: AC
  Filled 2021-12-26: qty 4

## 2021-12-26 MED ORDER — GELATIN ABSORBABLE 12-7 MM EX MISC
CUTANEOUS | Status: AC
  Filled 2021-12-26: qty 1

## 2021-12-26 MED ORDER — FENTANYL CITRATE (PF) 100 MCG/2ML IJ SOLN
INTRAMUSCULAR | Status: AC | PRN
  Administered 2021-12-26: 50 ug via INTRAVENOUS

## 2021-12-26 MED ORDER — LIDOCAINE-EPINEPHRINE 1 %-1:100000 IJ SOLN
INTRAMUSCULAR | Status: AC
  Filled 2021-12-26: qty 1

## 2021-12-26 MED ORDER — MIDAZOLAM HCL 2 MG/2ML IJ SOLN
INTRAMUSCULAR | Status: AC | PRN
  Administered 2021-12-26: .5 mg via INTRAVENOUS
  Administered 2021-12-26 (×2): 1 mg via INTRAVENOUS

## 2021-12-26 MED ORDER — SODIUM CHLORIDE 0.9 % IV SOLN: INTRAVENOUS | Status: DC

## 2021-12-26 MED ORDER — MIDAZOLAM HCL 2 MG/2ML IJ SOLN
INTRAMUSCULAR | Status: AC
  Filled 2021-12-26: qty 4

## 2021-12-26 MED ORDER — DEXTROSE 50 % IV SOLN
INTRAVENOUS | Status: AC
  Administered 2021-12-26: 50 mL
  Filled 2021-12-26: qty 50

## 2021-12-26 NOTE — Progress Notes (Signed)
CBG was 74 upon arrival. Recheck CBG 60. 25 ml of D50 given. ?

## 2021-12-26 NOTE — Procedures (Signed)
Pre Procedure Dx: Elevated LFTs of uncertain etiology Post Procedural Dx: Same  Technically successful US guided biopsy of right lobe of the liver.  EBL: None No immediate complications.   Jay , MD Pager #: [phone]    

## 2021-12-26 NOTE — H&P (Signed)
? ?Chief Complaint: ?Patient was seen in consultation today for cirrhosis ? ?Referring Physician(s): ?[place] ?Supervising Physician: Sandi Mariscal ? ?Patient Status: [facility] - Out-pt ? ?History of Present Illness: ?Maria Fisher is a 66 y.o. female with past medical history of DM, HLD, HTN, cirrhosis who presents to [facility] for random liver biopsy.  Patient presents today in her usual state of health.  She has been NPO. She does not take blood thinners.  She has no complaints or concerns today and denies fever, chills, nausea, vomiting, abdominal pain, dysuria.   ? ? ?Past Medical History:  ?Diagnosis Date  ? Back pain   ? Diabetes mellitus without complication ([place])   ? Heart murmur   ? Hyperlipidemia   ? Hypertension   ? Neck pain   ? Neuromuscular disorder ([place])   ? Shoulder pain, bilateral   ? Thyroid nodule   ? ? ?Past Surgical History:  ?Procedure Laterality Date  ? ABDOMINAL HYSTERECTOMY    ? CESAREAN SECTION    ? x 2  ? CHOLECYSTECTOMY    ? ? ?Allergies: ?Patient has no known allergies. ? ?Medications: ?Prior to Admission medications   ?Medication Sig Start Date End Date Taking? Authorizing Provider  ?aspirin EC 81 MG tablet Take 81 mg by mouth daily.   Yes [provider]  ?atorvastatin (LIPITOR) 40 MG tablet Take 40 mg by mouth daily.  09/14/17  Yes [provider]  ?glipiZIDE (GLUCOTROL XL) 10 MG 24 hr tablet Take 10 mg by mouth daily. 12/23/21  Yes [provider]  ?lisinopril-hydrochlorothiazide (ZESTORETIC) 20-12.5 MG tablet Take 1 tablet by mouth daily. 12/23/21  Yes [provider]  ?metFORMIN (GLUCOPHAGE) 1000 MG tablet Take 1 tablet (1,000 mg total) by mouth 2 (two) times daily with a meal. 05/27/18  Yes Hagler, Apolonio Schneiders, MD  ?OZEMPIC, 0.25 OR 0.5 MG/DOSE, 2 MG/1.5ML SOPN Inject 0.5 mg into the skin every Wednesday. 10/19/21  Yes [provider]  ?traZODone (DESYREL) 100 MG tablet Take 100 mg by mouth at bedtime as needed for sleep. 12/23/21   Yes [provider]  ?blood glucose meter kit and supplies KIT Dispense based on patient and insurance preference.lancets to fit device. 05/27/18   Caren Macadam, MD  ?glucose blood (CONTOUR NEXT TEST) test strip 1 each by Other route 2 (two) times daily. Use as instructed 05/27/18   Caren Macadam, MD  ?lidocaine (XYLOCAINE) 2 % solution Use as directed 10 mLs in the mouth or throat every 3 (three) hours as needed for mouth pain. ?Patient not taking: Reported on 12/23/2021 11/12/21   Volney American, PA-C  ?promethazine-dextromethorphan (PROMETHAZINE-DM) 6.25-15 MG/5ML syrup Take 5 mLs by mouth 4 (four) times daily as needed. ?Patient not taking: Reported on 12/23/2021 11/12/21   Volney American, PA-C  ?  ? ?Family History  ?Problem Relation Age of Onset  ? Diabetes Mother   ? Heart disease Mother   ? Kidney disease Mother   ? Depression Mother   ? Hypertension Mother   ? Hyperlipidemia Mother   ? Cirrhosis Father   ? Alcohol abuse Father   ? Diabetes Sister   ? Hypertension Sister   ? Hypertension Brother   ? Hypertension Daughter   ? Hypertension Son   ? Colon cancer Neg Hx   ? ? ?Social History  ? ?Socioeconomic History  ? Marital status: Married  ?  Spouse name: Not on file  ? Number of children: 2  ? Years of education: 46  ?  Highest education level: Not on file  ?Occupational History  ? Occupation: Child care  ?Tobacco Use  ? Smoking status: Never  ? Smokeless tobacco: Never  ?Vaping Use  ? Vaping Use: Never used  ?Substance and Sexual Activity  ? Alcohol use: No  ? Drug use: No  ? Sexual activity: Not Currently  ?  Partners: Male  ?  Birth control/protection: None  ?Other Topics Concern  ? Not on file  ?Social History Narrative  ? Lives at home with her husband. Married.   ? Right-handed.  ? 3 cups coffee most days. Eats Fisher food groups.   ? Does walking for exercise.   ? 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   ? Enjoys gong to [employer] and [employer].  ? Has two children.    ? ?Social Determinants of Health  ? ?Financial Resource Strain: Not on file  ?Food Insecurity: Not on file  ?Transportation Needs: Not on file  ?Physical Activity: Not on file  ?Stress: Not on file  ?Social Connections: Not on file  ? ? ? ?Review of Systems: A 12 point ROS discussed and pertinent positives are indicated in the HPI above.  Fisher other systems are negative. ? ?Review of Systems  ?Constitutional:  Negative for fatigue and fever.  ?Respiratory:  Negative for cough and shortness of breath.   ?Cardiovascular:  Negative for chest pain.  ?Gastrointestinal:  Negative for abdominal pain.  ?Musculoskeletal:  Negative for back pain.  ?Psychiatric/Behavioral:  Negative for behavioral problems.   ? ?Vital Signs: ?BP (!) 140/91   Pulse 78   Temp 98.6 ?F (37 ?C) (Oral)   Resp 18   Ht 5' (1.524 m)   Wt 190 lb (86.2 kg)   SpO2 100%   BMI 37.11 kg/m?  ? ?Physical Exam ?Vitals and nursing note reviewed.  ?Constitutional:   ?   General: She is not in acute distress. ?   Appearance: Normal appearance. She is not ill-appearing.  ?HENT:  ?   Mouth/Throat:  ?   Mouth: Mucous membranes are moist.  ?   Pharynx: Oropharynx is clear.  ?Cardiovascular:  ?   Rate and Rhythm: Normal rate and regular rhythm.  ?Pulmonary:  ?   Effort: Pulmonary effort is normal.  ?Abdominal:  ?   General: Abdomen is flat.  ?   Palpations: Abdomen is soft.  ?Skin: ?   General: Skin is warm and dry.  ?Neurological:  ?   General: No focal deficit present.  ?   Mental Status: She is alert and oriented to person, place, and time. Mental status is at baseline.  ?Psychiatric:     ?   Mood and Affect: Mood normal.     ?   Behavior: Behavior normal.     ?   Thought Content: Thought content normal.     ?   Judgment: Judgment normal.  ? ? ? ?MD Evaluation ?Airway: WNL ?Heart: WNL ?Abdomen: WNL ?Chest/ Lungs: WNL ?ASA  Classification: 3 ?Mallampati/Airway Score: Two ? ? ?Imaging: ?No results found. ? ?Labs: ? ?CBC: ?Recent Labs  ?  12/26/21 ?1105  ?WBC  9.2  ?HGB 12.3  ?HCT 37.8  ?PLT 231  ? ? ?COAGS: ?Recent Labs  ?  12/26/21 ?1105  ?INR 1.0  ? ? ?BMP: ?No results for input(s): NA, K, CL, CO2, GLUCOSE, BUN, CALCIUM, CREATININE, GFRNONAA, GFRAA in the last 8760 hours. ? ?Invalid input(s): CMP ? ?LIVER FUNCTION TESTS: ?No results for input(s): BILITOT, AST, ALT,  ALKPHOS, PROT, ALBUMIN in the last 8760 hours. ? ?TUMOR MARKERS: ?No results for input(s): AFPTM, CEA, CA199, CHROMGRNA in the last 8760 hours. ? ?Assessment and Plan: ?Patient with past medical history of hepatic cirrhosis presents with complaint of hepatic cirrhosis.  ?IR consulted for random liver biopsy at the request of [facility]. ?Case reviewed by Dr. Pascal Lux who approves patient for procedure.  ?Patient presents today in their usual state of health.  ?She has been NPO and is not currently on blood thinners.  ? ?Risks and benefits was discussed with the patient and/or patient's family including, but not limited to bleeding, infection, damage to adjacent structures or low yield requiring additional tests. ? ?Fisher of the questions were answered and there is agreement to proceed. ? ?Consent signed and in chart. ? ? ?Thank you for this interesting consult.  I greatly enjoyed meeting Maria Fisher and look forward to participating in their care.  A copy of this report was sent to the requesting provider on this date. ? ?Electronically Signed: ?Docia Barrier, PA ?12/26/2021, 1:30 PM ? ? ?I spent a total of  30 Minutes   in face to face in clinical consultation, greater than 50% of which was counseling/coordinating care for hepatic cirrhosis.  ? ?

## 2021-12-28 ENCOUNTER — Encounter (HOSPITAL_COMMUNITY): Payer: Self-pay

## 2021-12-30 LAB — SURGICAL PATHOLOGY

## 2022-01-05 ENCOUNTER — Ambulatory Visit (HOSPITAL_COMMUNITY): Payer: Medicare Other

## 2022-01-09 DIAGNOSIS — K754 Autoimmune hepatitis: Secondary | ICD-10-CM | POA: Diagnosis not present

## 2022-01-18 DIAGNOSIS — K7460 Unspecified cirrhosis of liver: Secondary | ICD-10-CM | POA: Diagnosis not present

## 2022-02-01 ENCOUNTER — Ambulatory Visit (HOSPITAL_COMMUNITY): Payer: Medicare Other

## 2022-02-03 ENCOUNTER — Ambulatory Visit (HOSPITAL_COMMUNITY)
Admission: RE | Admit: 2022-02-03 | Discharge: 2022-02-03 | Disposition: A | Discharge status: Home / Self Care | Payer: Medicare Other | Source: Ambulatory Visit | Attending: Family Medicine | Admitting: Family Medicine

## 2022-02-03 DIAGNOSIS — Z1231 Encounter for screening mammogram for malignant neoplasm of breast: Secondary | ICD-10-CM | POA: Diagnosis not present

## 2022-02-20 ENCOUNTER — Ambulatory Visit: Payer: Medicare Other | Admitting: Podiatry

## 2022-02-20 DIAGNOSIS — M2141 Flat foot [pes planus] (acquired), right foot: Secondary | ICD-10-CM

## 2022-02-20 DIAGNOSIS — M79674 Pain in right toe(s): Secondary | ICD-10-CM | POA: Diagnosis not present

## 2022-02-20 DIAGNOSIS — B351 Tinea unguium: Secondary | ICD-10-CM

## 2022-02-20 DIAGNOSIS — M79675 Pain in left toe(s): Secondary | ICD-10-CM | POA: Diagnosis not present

## 2022-02-20 DIAGNOSIS — M2142 Flat foot [pes planus] (acquired), left foot: Secondary | ICD-10-CM

## 2022-02-20 DIAGNOSIS — E119 Type 2 diabetes mellitus without complications: Secondary | ICD-10-CM | POA: Diagnosis not present

## 2022-02-20 NOTE — Progress Notes (Signed)
  Subjective:  Patient ID: Maria Fisher, female    DOB: 1955-09-22,  MRN: [id number]  Chief Complaint  Patient presents with   Nail Problem    Thick painful toenails,    Diabetes    Diabetic foot exam    66 y.o. female presents with the above complaint. History confirmed with patient.  Nails are thickened and elongated again and causing discomfort she wonders if there is any that can be done to improve them.  Also would like to have new diabetic shoes made  Objective:  Physical Exam: warm, good capillary refill, no trophic changes or ulcerative lesions, normal DP and PT pulses, normal monofilament exam, normal sensory exam and onychomycosis x10 with thickened elongated nail plates and yellow discoloration. Assessment:   1. Pain due to onychomycosis of toenails of both feet   2. Pes planus of both feet   3. Type 2 diabetes mellitus without complication, without long-term current use of insulin ([place])   4. Encounter for comprehensive diabetic foot examination, type 2 diabetes mellitus ([place])      Plan:  Patient was evaluated and treated and all questions answered.  Patient educated on diabetes. Discussed proper diabetic foot care and discussed risks and complications of disease. Educated patient in depth on reasons to return to the office immediately should he/she discover anything concerning or new on the feet. All questions answered. Discussed proper shoes as well.  She will be seen by the pedorthist for fitting for extra-depth diabetic shoes and multidensity insoles  Discussed the etiology and treatment options for the condition in detail with the patient. Educated patient on the topical and oral treatment options for mycotic nails. Recommended debridement of the nails today. Sharp and mechanical debridement performed of all painful and mycotic nails today. Nails debrided in length and thickness using a nail nipper to level of comfort. Discussed treatment options including appropriate  shoe gear. Follow up as needed for painful nails.  Unfortunate do not think there is a reasonable way to improve the nail color and texture return them to their previous status.  She will be seen for routine regular care for these now.    Return in about 3 months (around 05/23/2022) for at risk diabetic foot care.

## 2022-02-24 ENCOUNTER — Ambulatory Visit (INDEPENDENT_AMBULATORY_CARE_PROVIDER_SITE_OTHER): Payer: Medicare Other | Admitting: *Deleted

## 2022-02-24 DIAGNOSIS — E119 Type 2 diabetes mellitus without complications: Secondary | ICD-10-CM

## 2022-02-24 DIAGNOSIS — M2141 Flat foot [pes planus] (acquired), right foot: Secondary | ICD-10-CM

## 2022-02-24 DIAGNOSIS — M2142 Flat foot [pes planus] (acquired), left foot: Secondary | ICD-10-CM

## 2022-03-02 ENCOUNTER — Encounter (HOSPITAL_COMMUNITY): Payer: Self-pay | Admitting: Gastroenterology

## 2022-03-02 NOTE — Progress Notes (Signed)
Attempted to obtain medical history via telephone, unable to reach at this time. HIPAA compliant voicemail message left requesting return call to pre surgical testing department. 

## 2022-03-13 NOTE — H&P (Signed)
History of Present Illness  General:  65/female USg 12/22: Cirrhosis labs 12/22; TB/ASt/ALT/ALP pf 0.4/45/58/90 HCV AB negative in 6/21 Iron panel: normal ferritin, low iron sat FIT negative from 7/21 Denies NSAID use denies tobacco use denies alcohol use (ever). father died from cirrhosis, he was an alcoholic. patient states she had a colonoscopy with Dr. Gala Romney about 20 years ago. reports a few polyps. will obtain report. patient states her diet is consistent with fatty/greasy/fried and canned foods. states she has never been diagnosed with cirrhosis before and she is concerned because she didn't know you could get cirrhosis without alcohol. denies change in bowel habits. has 1 soft/formed BM daily denies melena/hematochezia denies GERD denies dysphagia denies nausea/vomiting denies weight loss denies fluid retention (ascities/peripheral edema).   Current Medications  Taking   Lisinopril-hydroCHLOROthiazide 10-12.5 MG Tablet 1 tablet Orally Once a day  Ozempic (0.25 or 0.5 MG/DOSE)(Semaglutide(0.25 or 0.'5MG'$ /DOS)) 2 MG/1.5ML Solution Pen-injector 0.5 mg Subcutaneous Once a week  traZODone HCl 100 MG Tablet 1 tablet Orally at bedtime  Atorvastatin Calcium 40 mg Tablet TAKE ONE (1) TABLET BY MOUTH EVERY DAY   glipiZIDE ER 10 mg Tablet Extended Release 24 Hour TAKE ONE (1) TABLET BY MOUTH EVERY DAY WITH BREAKFAST Orally once a day  metFORMIN HCl 1000 mg Tablet 1 tablet with a meal by mouth twice a day  Vitamin D (Ergocalciferol) 1.25 MG (50000 UT) Capsule 1 capsule Orally Once a week  Aspirin 81(Aspirin) 81 MG Tablet Chewable 1 tablet Orally Once a day  Sure Comfort Pen Needles(Insulin Pen Needle) 31G X 5 MM Miscellaneous USE AS DIRECTED ONCE A DAY.   True Metrix Blood Glucose Test(Blood Glucose Test) - Strip use to check blood sugar twice a day. In Vitro once a day  TRUEplus Lancets 33G - Miscellaneous use as directed twice a day for finger stick. finger stick once a day  Meclizine  HCl 25 MG Tablet 1 tablet Orally three times daily as needed for dizziness or nausea  Medication List reviewed and reconciled with the patient   Past Medical History  Back pain, chronic.   Diabetes mellitus Type 2, dx 1990s.   Heart Murmur.   Hyperlipidemia.   Hypertension.   Neck pain.   Neuromuscular disorder.   Shoulder pain, bilateral.   Thyroid nodule.   12/2020 [facility]/Dr. Sherryle Lis, DM foot care.   02/2021 [facility], CXR normal/URI.   02/2021, picked up DM shoes at [facility], Dr. Sherryle Lis.   11/2021 [facility], rx mulnupivir for COVID.   Fatty liver.    Surgical History  Abdominal Hysterectomy   c- section x 2 1979, 1989  Cholecystectomy   thyroid biopsies, Dr Gerkin 2019   Family History  Father: deceased, cirrhosis alcohol abuse  Mother: deceased, heart disease kidney disease depression, diagnosed with Diabetes, Hypertension  Paternal Quintana Father: deceased  Paternal Grand Mother: deceased  Maternal Grand Father: deceased  Maternal Grand Mother: deceased  Brother 1: alive 8 yrs, diagnosed with Hypertension  Sister 1: alive 38 yrs, diagnosed with Diabetes, Hypertension  Daughter(s): diagnosed with Hypertension  Son(s): diagnosed with Hypertension  1 brother(s) , 1 sister(s) . 1 son(s) , 1 daughter(s) .   Neg Hx of Colon Cancer.   Social History  General:  Tobacco use cigarettes: Never smoked, Tobacco history last updated 12/06/2021, Vaping No. EXPOSURE TO PASSIVE SMOKE: yes, in the past, mother. no Alcohol. Caffeine: yes, coffee 3-4 cups daily, tea 2-3 a day. no Recreational drug use. no Exercise. DENTAL  CARE: See a dentist as needed. Marital Status: married. Children: 2 children, 1 son 1 daughter. EDUCATION: Some College. 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 Religion: Holiness, Non Denominational.    Allergies  Farxiga: N/V - Side Effects   Hospitalization/Major Diagnostic Procedure  none in the past year 12/2021   Review of  Systems  GI PROCEDURE:  Pacemaker/ AICD no. Artificial heart valves no. MI/heart attack no. Abnormal heart rhythm no. Angina no. CVA no. Hypertension YES. Hypotension no. Asthma, COPD no. Sleep apnea no. Seizure disorders no. Artificial joints no. Severe DJD no. Diabetes YES, type II. Significant headaches no. Vertigo YES. Depression/anxiety no. Abnormal bleeding no. Kidney Disease no. Liver disease fatty liver. Chance of pregnancy no. Blood transfusion no   Vital Signs  Wt 190.2, Wt change -8 lb, Ht 59.5, BMI 37.77, Temp 97.9, Pulse sitting 85, BP sitting 116/76.   Examination  Gastroenterology Exam: GENERAL APPEARANCE: obese, no active distress, pleasant, no acute distress . EYES: Lids and conjunctiva normal. Sclera normal, pupils equal and reactive. RESPIRATORY Breath sounds normal. Respiration even and unlabored. CARDIOVASCULAR Normal RRR w/o murmers or gallops. No peripheral edema. ABDOMEN No masses palpated. Liver and spleen not palpated, normal. Bowel sounds normal, Abdomen not distended. SKIN Warm and dry. PSYCHIATRIC Alert and oriented x3, mood and affect appear normal..     Assessments     1. Cirrhosis of liver without ascites, unspecified hepatic cirrhosis type - K74.60 (Primary)   2. History of colon polyps - Z86.010   Treatment  1. Cirrhosis of liver without ascites, unspecified hepatic cirrhosis type  LAB: PT (Prothrombin Time) (035465) LAB: CBC with Diff LAB: Comp Metabolic Panel LAB: Ferritin LAB: Iron Panel LAB: Alpha-1-Antitrypsin Deficiency (681275) LAB: ASMA/AMA Panel (170017) LAB: Ceruloplasmin (494496) LAB: HBsAg Screen, Qual (759163) LAB: HBV Core Ab, IgG/IgM Diff (846659) Negative  LAB: HBsAb, Quant (935701) IMAGING: Colon/EGD IMAGING: IH US Abdominal, Complete 76700 (Ordered for 12/06/2021) Clinical Notes:  will get full workup for etiology of cirrhosis, though fatty liver is likely etiology with diet and obesity. repeat US every 6 months to screen for  hepatic carcinoma, due to obtain 01/2022.   EGD to screen for esophageal varices due to diagnosis of cirrhosis. will be done at the hospital.   extensive discussion with patient and husband about cirrhosis including possibility of decompensated cirrhosis. discussed 2 g low sodium diet as well and provided cirrhosis up to date beyond the basics handout and 2g sodium diet handout. .  2. History of colon polyps  IMAGING: Colon/EGD

## 2022-03-14 ENCOUNTER — Encounter (HOSPITAL_COMMUNITY)
Admission: RE | Disposition: A | Discharge status: Home / Self Care | Payer: Self-pay | Source: Home / Self Care | Attending: Gastroenterology

## 2022-03-14 ENCOUNTER — Ambulatory Visit (HOSPITAL_COMMUNITY): Payer: Medicare Other | Admitting: Anesthesiology

## 2022-03-14 ENCOUNTER — Other Ambulatory Visit: Payer: Self-pay

## 2022-03-14 ENCOUNTER — Ambulatory Visit (HOSPITAL_COMMUNITY)
Admission: RE | Admit: 2022-03-14 | Discharge: 2022-03-14 | Disposition: A | Discharge status: Home / Self Care | Payer: Medicare Other | Attending: Gastroenterology | Admitting: Gastroenterology

## 2022-03-14 ENCOUNTER — Ambulatory Visit (HOSPITAL_BASED_OUTPATIENT_CLINIC_OR_DEPARTMENT_OTHER): Payer: Medicare Other | Admitting: Anesthesiology

## 2022-03-14 ENCOUNTER — Encounter (HOSPITAL_COMMUNITY): Payer: Self-pay | Admitting: Gastroenterology

## 2022-03-14 DIAGNOSIS — K5730 Diverticulosis of large intestine without perforation or abscess without bleeding: Secondary | ICD-10-CM | POA: Insufficient documentation

## 2022-03-14 DIAGNOSIS — Z6839 Body mass index (BMI) 39.0-39.9, adult: Secondary | ICD-10-CM | POA: Insufficient documentation

## 2022-03-14 DIAGNOSIS — K7460 Unspecified cirrhosis of liver: Secondary | ICD-10-CM | POA: Diagnosis not present

## 2022-03-14 DIAGNOSIS — K648 Other hemorrhoids: Secondary | ICD-10-CM

## 2022-03-14 DIAGNOSIS — K621 Rectal polyp: Secondary | ICD-10-CM | POA: Diagnosis not present

## 2022-03-14 DIAGNOSIS — K635 Polyp of colon: Secondary | ICD-10-CM | POA: Diagnosis not present

## 2022-03-14 DIAGNOSIS — K319 Disease of stomach and duodenum, unspecified: Secondary | ICD-10-CM | POA: Diagnosis not present

## 2022-03-14 DIAGNOSIS — Z86010 Personal history of colonic polyps: Secondary | ICD-10-CM | POA: Insufficient documentation

## 2022-03-14 DIAGNOSIS — Z7722 Contact with and (suspected) exposure to environmental tobacco smoke (acute) (chronic): Secondary | ICD-10-CM | POA: Diagnosis not present

## 2022-03-14 DIAGNOSIS — E119 Type 2 diabetes mellitus without complications: Secondary | ICD-10-CM | POA: Insufficient documentation

## 2022-03-14 DIAGNOSIS — Z1211 Encounter for screening for malignant neoplasm of colon: Secondary | ICD-10-CM | POA: Diagnosis not present

## 2022-03-14 DIAGNOSIS — Z7984 Long term (current) use of oral hypoglycemic drugs: Secondary | ICD-10-CM | POA: Insufficient documentation

## 2022-03-14 DIAGNOSIS — D123 Benign neoplasm of transverse colon: Secondary | ICD-10-CM | POA: Insufficient documentation

## 2022-03-14 DIAGNOSIS — K2950 Unspecified chronic gastritis without bleeding: Secondary | ICD-10-CM | POA: Diagnosis not present

## 2022-03-14 DIAGNOSIS — Z8619 Personal history of other infectious and parasitic diseases: Secondary | ICD-10-CM | POA: Insufficient documentation

## 2022-03-14 DIAGNOSIS — I10 Essential (primary) hypertension: Secondary | ICD-10-CM | POA: Diagnosis not present

## 2022-03-14 DIAGNOSIS — K922 Gastrointestinal hemorrhage, unspecified: Secondary | ICD-10-CM | POA: Diagnosis not present

## 2022-03-14 DIAGNOSIS — E669 Obesity, unspecified: Secondary | ICD-10-CM | POA: Insufficient documentation

## 2022-03-14 HISTORY — PX: ESOPHAGOGASTRODUODENOSCOPY (EGD) WITH PROPOFOL: SHX5813

## 2022-03-14 HISTORY — PX: POLYPECTOMY: SHX5525

## 2022-03-14 HISTORY — PX: BIOPSY: SHX5522

## 2022-03-14 HISTORY — PX: COLONOSCOPY WITH PROPOFOL: SHX5780

## 2022-03-14 LAB — GLUCOSE, CAPILLARY: Glucose-Capillary: 136 mg/dL — ABNORMAL HIGH (ref 70–99)

## 2022-03-14 SURGERY — COLONOSCOPY WITH PROPOFOL
Anesthesia: Monitor Anesthesia Care

## 2022-03-14 MED ORDER — LACTATED RINGERS IV SOLN
INTRAVENOUS | Status: DC
  Administered 2022-03-14: 1000 mL via INTRAVENOUS

## 2022-03-14 MED ORDER — PROPOFOL 500 MG/50ML IV EMUL
INTRAVENOUS | Status: DC | PRN
  Administered 2022-03-14: 150 ug/kg/min via INTRAVENOUS

## 2022-03-14 MED ORDER — PROPOFOL 1000 MG/100ML IV EMUL
INTRAVENOUS | Status: AC
  Filled 2022-03-14: qty 100

## 2022-03-14 MED ORDER — PHENYLEPHRINE 80 MCG/ML (10ML) SYRINGE FOR IV PUSH (FOR BLOOD PRESSURE SUPPORT)
PREFILLED_SYRINGE | INTRAVENOUS | Status: DC | PRN
  Administered 2022-03-14: 80 ug via INTRAVENOUS

## 2022-03-14 MED ORDER — SODIUM CHLORIDE 0.9 % IV SOLN: INTRAVENOUS | Status: DC

## 2022-03-14 MED ORDER — PROPOFOL 10 MG/ML IV BOLUS
INTRAVENOUS | Status: DC | PRN
  Administered 2022-03-14: 20 mg via INTRAVENOUS
  Administered 2022-03-14 (×3): 30 mg via INTRAVENOUS

## 2022-03-14 SURGICAL SUPPLY — 25 items

## 2022-03-14 NOTE — Interval H&P Note (Signed)
History and Physical Interval Note: 65/female with cirrhosis and history of colon polyps for EGD for variceal screening and possible banding and surveillance colonoscopy with propofol.  03/14/2022 7:32 AM  Maria Fisher  has presented today for EGD with possible banding and colonoscopy, with the diagnosis of Cirrhosis of liver/Hx of polyps.  The various methods of treatment have been discussed with the patient and family. After consideration of risks, benefits and other options for treatment, the patient has consented to  Procedure(s): COLONOSCOPY WITH PROPOFOL (N/A) ESOPHAGOGASTRODUODENOSCOPY (EGD) WITH PROPOFOL (N/A) GASTRIC VARICES BANDING (N/A) as a surgical intervention.  The patient's history has been reviewed, patient examined, no change in status, stable for surgery.  I have reviewed the patient's chart and labs.  Questions were answered to the patient's satisfaction.     [name]

## 2022-03-14 NOTE — Transfer of Care (Signed)
Immediate Anesthesia Transfer of Care Note  Patient: Maria Fisher  Procedure(s) Performed: COLONOSCOPY WITH PROPOFOL ESOPHAGOGASTRODUODENOSCOPY (EGD) WITH PROPOFOL BIOPSY POLYPECTOMY  Patient Location: Endoscopy Unit  Anesthesia Type:MAC  Level of Consciousness: drowsy  Airway & Oxygen Therapy: Patient Spontanous Breathing and Patient connected to face mask  Post-op Assessment: Report given to RN and Post -op Vital signs reviewed and stable  Post vital signs: Reviewed and stable  Last Vitals:  Vitals Value Taken Time  BP    Temp    Pulse    Resp 17 03/14/22 0902  SpO2    Vitals shown include unvalidated device data.  Last Pain:  Vitals:   03/14/22 0717  TempSrc: Tympanic  PainSc: 0-No pain         Complications: No notable events documented.

## 2022-03-14 NOTE — Anesthesia Postprocedure Evaluation (Signed)
Anesthesia Post Note  Patient: Maria Fisher  Procedure(s) Performed: COLONOSCOPY WITH PROPOFOL ESOPHAGOGASTRODUODENOSCOPY (EGD) WITH PROPOFOL BIOPSY POLYPECTOMY     Patient location during evaluation: PACU Anesthesia Type: MAC Level of consciousness: awake and alert Pain management: pain level controlled Vital Signs Assessment: post-procedure vital signs reviewed and stable Respiratory status: spontaneous breathing, nonlabored ventilation and respiratory function stable Cardiovascular status: blood pressure returned to baseline and stable Postop Assessment: no apparent nausea or vomiting Anesthetic complications: no   No notable events documented.  Last Vitals:  Vitals:   03/14/22 0910 03/14/22 0920  BP: 100/62 (!) 144/92  Pulse: (!) 46 (!) 59  Resp: 16 14  Temp:    SpO2: 100% 100%    Last Pain:  Vitals:   03/14/22 0920  TempSrc:   PainSc: 0-No pain                 Pervis Hocking

## 2022-03-14 NOTE — Anesthesia Procedure Notes (Signed)
Procedure Name: MAC Date/Time: 03/14/2022 8:30 AM  Performed by: Claudia Desanctis, CRNAPre-anesthesia Checklist: Patient identified, Emergency Drugs available, Suction available and Patient being monitored Patient Re-evaluated:Patient Re-evaluated prior to induction Oxygen Delivery Method: Simple face mask

## 2022-03-14 NOTE — Anesthesia Preprocedure Evaluation (Addendum)
Anesthesia Evaluation  Patient identified by MRN, date of birth, ID band Patient awake    Reviewed: Allergy & Precautions, NPO status , Patient's Chart, lab work & pertinent test results  Airway Mallampati: III  TM Distance: >3 FB Neck ROM: Full    Dental  (+) Teeth Intact, Dental Advisory Given   Pulmonary neg pulmonary ROS,    Pulmonary exam normal breath sounds clear to auscultation       Cardiovascular hypertension, Pt. on medications Normal cardiovascular exam Rhythm:Regular Rate:Normal     Neuro/Psych negative neurological ROS  negative psych ROS   GI/Hepatic negative GI ROS, (+) Cirrhosis       , Hx gastric varices No history of thrombocytopenia, normal INR   Endo/Other  diabetes, Type 2, Oral Hypoglycemic AgentsObesity BMI 39  Renal/GU negative Renal ROS  negative genitourinary   Musculoskeletal negative musculoskeletal ROS (+)   Abdominal (+) + obese,   Peds  Hematology negative hematology ROS (+)   Anesthesia Other Findings   Reproductive/Obstetrics negative OB ROS                            Anesthesia Physical Anesthesia Plan  ASA: 2  Anesthesia Plan: MAC   Post-op Pain Management:    Induction:   PONV Risk Score and Plan: 2 and Propofol infusion and TIVA  Airway Management Planned: Natural Airway and Simple Face Mask  Additional Equipment: None  Intra-op Plan:   Post-operative Plan:   Informed Consent: I have reviewed the patients History and Physical, chart, labs and discussed the procedure including the risks, benefits and alternatives for the proposed anesthesia with the patient or authorized representative who has indicated his/her understanding and acceptance.     Dental advisory given  Plan Discussed with: CRNA  Anesthesia Plan Comments:        Anesthesia Quick Evaluation

## 2022-03-14 NOTE — Op Note (Signed)
[facility] Patient Name: Maria Fisher Procedure Date: 03/14/2022 MRN: [id number] Attending MD: [name] , MD Date of Birth: 05-Jul-1956 CSN: [id number] Age: 66 Admit Type: Outpatient Procedure:                Upper GI endoscopy Indications:              Cirrhosis rule out esophageal varices Providers:                [name], MD, Jaci Carrel, RN, Despina Pole, Technician, Dellie Catholic Referring MD:             Cammy Brochure Medicines:                Monitored Anesthesia Care Complications:            No immediate complications. Estimated blood loss:                            Minimal. Estimated Blood Loss:     Estimated blood loss was minimal. Procedure:                Pre-Anesthesia Assessment:                           - Prior to the procedure, a History and Physical                            was performed, and patient medications and                            allergies were reviewed. The patient's tolerance of                            previous anesthesia was also reviewed. The risks                            and benefits of the procedure and the sedation                            options and risks were discussed with the patient.                            All questions were answered, and informed consent                            was obtained. Prior Anticoagulants: The patient has                            taken no previous anticoagulant or antiplatelet                            agents. ASA Grade Assessment: III - A patient with  severe systemic disease. After reviewing the risks                            and benefits, the patient was deemed in                            satisfactory condition to undergo the procedure.                           After obtaining informed consent, the endoscope was                            passed under direct vision. Throughout the                             procedure, the patient's blood pressure, pulse, and                            oxygen saturations were monitored continuously. The                            [id number]-[id number] ([id number] Olympus endoscope was introduced                            through the mouth, and advanced to the second part                            of duodenum. The upper GI endoscopy was                            accomplished without difficulty. The patient                            tolerated the procedure well. Scope In: Scope Out: Findings:      The examined esophagus was normal.      The Z-line was regular and was found 35 cm from the incisors.      A few dispersed small erosions with stigmata of recent bleeding were       found in the gastric body and in the gastric antrum. Biopsies were taken       with a cold forceps for Helicobacter pylori testing.      The cardia and gastric fundus were normal on retroflexion.      The examined duodenum was normal. Impression:               - Normal esophagus.                           - Z-line regular, 35 cm from the incisors.                           - Erosive gastropathy with stigmata of recent                            bleeding. Biopsied.                           -  Normal examined duodenum. Moderate Sedation:      Patient did not receive moderate sedation for this procedure, but       instead received monitored anesthesia care. Recommendation:           - Patient has a contact number available for                            emergencies. The signs and symptoms of potential                            delayed complications were discussed with the                            patient. Return to normal activities tomorrow.                            Written discharge instructions were provided to the                            patient.                           - Resume regular diet.                           - Continue present medications.                           -  Await pathology results.                           - Repeat upper endoscopy in 3 years for screening                            purposes. Procedure Code(s):        --- Professional ---                           [phone], Esophagogastroduodenoscopy, flexible,                            transoral; with biopsy, single or multiple Diagnosis Code(s):        --- Professional ---                           K92.2, Gastrointestinal hemorrhage, unspecified                           K74.60, Unspecified cirrhosis of liver CPT copyright 2019 [employer]. All rights reserved. The codes documented in this report are preliminary and upon coder review may  be revised to meet current compliance requirements. [name], MD 03/14/2022 9:03:06 AM This report has been signed electronically. Number of Addenda: 0

## 2022-03-14 NOTE — Discharge Instructions (Signed)
YOU HAD AN ENDOSCOPIC PROCEDURE TODAY: Refer to the procedure report and other information in the discharge instructions given to you for any specific questions about what was found during the examination. If this information does not answer your questions, please call [facility] office at [phone] to clarify.   YOU SHOULD EXPECT: Some feelings of bloating in the abdomen. Passage of more gas than usual. Walking can help get rid of the air that was put into your GI tract during the procedure and reduce the bloating. If you had a lower endoscopy (such as a colonoscopy or flexible sigmoidoscopy) you may notice spotting of blood in your stool or on the toilet paper. Some abdominal soreness may be present for a day or two, also.  DIET: Your first meal following the procedure should be a light meal and then it is ok to progress to your normal diet. A half-sandwich or bowl of soup is an example of a good first meal. Heavy or fried foods are harder to digest and may make you feel nauseous or bloated. Drink plenty of fluids but you should avoid alcoholic beverages for 24 hours. If you had a esophageal dilation, please see attached instructions for diet.    ACTIVITY: Your care partner should take you home directly after the procedure. You should plan to take it easy, moving slowly for the rest of the day. You can resume normal activity the day after the procedure however YOU SHOULD NOT DRIVE, use power tools, machinery or perform tasks that involve climbing or major physical exertion for 24 hours (because of the sedation medicines used during the test).   SYMPTOMS TO REPORT IMMEDIATELY: A gastroenterologist can be reached at any hour. Please call [phone]  for any of the following symptoms:   Following lower endoscopy (colonoscopy, flexible sigmoidoscopy) Excessive amounts of blood in the stool  Significant tenderness, worsening of abdominal pains  Swelling of the abdomen that is new, acute  Fever of 100  or higher  Following upper endoscopy (EGD, EUS, ERCP, esophageal dilation) Vomiting of blood or coffee ground material  New, significant abdominal pain  New, significant chest pain or pain under the shoulder blades  Painful or persistently difficult swallowing  New shortness of breath  Black, tarry-looking or red, bloody stools  FOLLOW UP:  If any biopsies were taken you will be contacted by phone or by letter within the next 1-3 weeks. Call [phone]  if you have not heard about the biopsies in 3 weeks.  Please also call with any specific questions about appointments or follow up tests.

## 2022-03-14 NOTE — Op Note (Signed)
[facility] Patient Name: Maria Fisher Procedure Date: 03/14/2022 MRN: [id number] Attending MD: [name] , MD Date of Birth: [birth year] CSN: [id number] Age: 66 Admit Type: Outpatient Procedure:                Colonoscopy Indications:              Screening for colorectal malignant neoplasm, Last                            colonoscopy 10 years ago Providers:                [name], MD, Jaci Carrel, RN, Despina Pole, Technician, Dellie Catholic Referring MD:             Cammy Brochure Medicines:                Monitored Anesthesia Care Complications:            No immediate complications. Estimated blood loss:                            Minimal. Estimated Blood Loss:     Estimated blood loss was minimal. Procedure:                Pre-Anesthesia Assessment:                           - Prior to the procedure, a History and Physical                            was performed, and patient medications and                            allergies were reviewed. The patient's tolerance of                            previous anesthesia was also reviewed. The risks                            and benefits of the procedure and the sedation                            options and risks were discussed with the patient.                            All questions were answered, and informed consent                            was obtained. Prior Anticoagulants: The patient has                            taken no previous anticoagulant or antiplatelet                            agents. ASA Grade  Assessment: III - A patient with                            severe systemic disease. After reviewing the risks                            and benefits, the patient was deemed in                            satisfactory condition to undergo the procedure.                           - Prior to the procedure, a History and Physical                            was  performed, and patient medications and                            allergies were reviewed. The patient's tolerance of                            previous anesthesia was also reviewed. The risks                            and benefits of the procedure and the sedation                            options and risks were discussed with the patient.                            All questions were answered, and informed consent                            was obtained. Prior Anticoagulants: The patient has                            taken no previous anticoagulant or antiplatelet                            agents. ASA Grade Assessment: III - A patient with                            severe systemic disease. After reviewing the risks                            and benefits, the patient was deemed in                            satisfactory condition to undergo the procedure.                           After obtaining informed consent, the colonoscope  was passed under direct vision. Throughout the                            procedure, the patient's blood pressure, pulse, and                            oxygen saturations were monitored continuously. The                            CF-HQ190L ([id number]) Olympus colonoscope was                            introduced through the anus and advanced to the the                            terminal ileum. The colonoscopy was performed                            without difficulty. The patient tolerated the                            procedure well. The quality of the bowel                            preparation was good. Scope In: 8:35:43 AM Scope Out: 8:56:30 AM Scope Withdrawal Time: 0 hours 16 minutes 50 seconds  Total Procedure Duration: 0 hours 20 minutes 47 seconds  Findings:      The perianal and digital rectal examinations were normal.      The terminal ileum appeared normal.      A 8 mm polyp was found in the transverse colon.  The polyp was       semi-sessile. The polyp was removed with a hot snare. Resection and       retrieval were complete.      Multiple small and large-mouthed diverticula were found in the sigmoid       colon, descending colon and transverse colon.      A 5 mm polyp was found in the rectum. The polyp was sessile. The polyp       was removed with a cold biopsy forceps. Resection and retrieval were       complete.      Non-bleeding internal hemorrhoids were found during retroflexion. Impression:               - The examined portion of the ileum was normal.                           - One 8 mm polyp in the transverse colon, removed                            with a hot snare. Resected and retrieved.                           - Diverticulosis in the sigmoid colon, in the  descending colon and in the transverse colon.                           - One 5 mm polyp in the rectum, removed with a cold                            biopsy forceps. Resected and retrieved.                           - Non-bleeding internal hemorrhoids. Moderate Sedation:      Patient did not receive moderate sedation for this procedure, but       instead received monitored anesthesia care. Recommendation:           - Patient has a contact number available for                            emergencies. The signs and symptoms of potential                            delayed complications were discussed with the                            patient. Return to normal activities tomorrow.                            Written discharge instructions were provided to the                            patient.                           - High fiber diet.                           - Continue present medications.                           - Await pathology results.                           - Repeat colonoscopy for surveillance based on                            pathology results. Procedure Code(s):        ---  Professional ---                           [phone], Colonoscopy, flexible; with removal of                            tumor(s), polyp(s), or other lesion(s) by snare                            technique  03491, 33, Colonoscopy, flexible; with biopsy,                            single or multiple Diagnosis Code(s):        --- Professional ---                           Z12.11, Encounter for screening for malignant                            neoplasm of colon                           K64.8, Other hemorrhoids                           K63.5, Polyp of colon                           K62.1, Rectal polyp                           K57.30, Diverticulosis of large intestine without                            perforation or abscess without bleeding CPT copyright 2019 American [employer] Association. All rights reserved. The codes documented in this report are preliminary and upon coder review may  be revised to meet current compliance requirements. [name], MD 03/14/2022 9:06:38 AM This report has been signed electronically. Number of Addenda: 0

## 2022-03-15 ENCOUNTER — Encounter (HOSPITAL_COMMUNITY): Payer: Self-pay | Admitting: Gastroenterology

## 2022-03-15 LAB — SURGICAL PATHOLOGY

## 2022-03-20 DIAGNOSIS — K7469 Other cirrhosis of liver: Secondary | ICD-10-CM | POA: Diagnosis not present

## 2022-03-20 DIAGNOSIS — K754 Autoimmune hepatitis: Secondary | ICD-10-CM | POA: Diagnosis not present

## 2022-05-04 DIAGNOSIS — I10 Essential (primary) hypertension: Secondary | ICD-10-CM | POA: Diagnosis not present

## 2022-05-04 DIAGNOSIS — E1169 Type 2 diabetes mellitus with other specified complication: Secondary | ICD-10-CM | POA: Diagnosis not present

## 2022-05-04 DIAGNOSIS — E782 Mixed hyperlipidemia: Secondary | ICD-10-CM | POA: Diagnosis not present

## 2022-05-29 ENCOUNTER — Ambulatory Visit (INDEPENDENT_AMBULATORY_CARE_PROVIDER_SITE_OTHER): Payer: Medicare Other | Admitting: Podiatry

## 2022-05-29 DIAGNOSIS — E119 Type 2 diabetes mellitus without complications: Secondary | ICD-10-CM | POA: Diagnosis not present

## 2022-05-29 DIAGNOSIS — B351 Tinea unguium: Secondary | ICD-10-CM | POA: Diagnosis not present

## 2022-05-29 DIAGNOSIS — M79674 Pain in right toe(s): Secondary | ICD-10-CM | POA: Diagnosis not present

## 2022-05-29 DIAGNOSIS — M79675 Pain in left toe(s): Secondary | ICD-10-CM | POA: Diagnosis not present

## 2022-05-29 NOTE — Progress Notes (Signed)
  Subjective:  Patient ID: Maria Fisher, female    DOB: 29-Dec-1955,  MRN: [id number]  Maria Fisher presents to clinic today for  Chief Complaint  Patient presents with   Nail Problem    Diabetic foot care BS- Did not check today A1C-Don't know PCP-[facility] PCP Vst- 3 months ago   New problem(s): None  PCP is Caren Macadam, MD , and last visit was  December 20, 2021.  Allergies  Allergen Reactions   Dapagliflozin Nausea And Vomiting    Review of Systems: Negative except as noted in the HPI.  Objective: No changes noted in today's physical examination. Maria Fisher is a pleasant 66 y.o. female in NAD. AAO x 3.  Vascular Examination: Vascular status intact b/l with palpable pedal pulses. Pedal hair present b/l. CFT immediate b/l. No edema. No pain with calf compression b/l. Skin temperature gradient WNL b/l.   Neurological Examination: Sensation grossly intact b/l with 10 gram monofilament. Vibratory sensation intact b/l.   Dermatological Examination: Pedal skin with normal turgor, texture and tone b/l. Toenails 1-5 b/l thick, discolored, elongated with subungual debris and pain on dorsal palpation. No hyperkeratotic lesions noted b/l.   Musculoskeletal Examination: Muscle strength 5/5 to b/l LE. No pain, crepitus or joint limitation noted with ROM bilateral LE. Pes planus deformity noted bilateral LE.  Radiographs: None  Assessment/Plan: 1. Pain due to onychomycosis of toenails of both feet   2. Type 2 diabetes mellitus without complication, without long-term current use of insulin ([place])     -Patient was evaluated and treated. All patient's and/or POA's questions/concerns answered on today's visit. -Continue foot and shoe inspections daily. Monitor blood glucose per PCP/Endocrinologist's recommendations. -Toenails 1-5 b/l were debrided in length and girth with sterile nail nippers and dremel without iatrogenic bleeding.  -Patient/POA to call should there be  question/concern in the interim.   Return in about 3 months (around 08/28/2022).  Marzetta Board, DPM

## 2022-07-01 ENCOUNTER — Telehealth: Payer: Self-pay | Admitting: Podiatry

## 2022-07-01 ENCOUNTER — Encounter: Payer: Self-pay | Admitting: Podiatry

## 2022-07-01 NOTE — Telephone Encounter (Signed)
LVM INFORMING PT THAT HER CMN HAS EXPIRED BUT I WILL BE RESENDING IT OVER. I AM ALSO SENDING HER A LETTER.

## 2022-07-18 ENCOUNTER — Telehealth: Payer: Self-pay | Admitting: Podiatry

## 2022-07-18 DIAGNOSIS — Z23 Encounter for immunization: Secondary | ICD-10-CM | POA: Diagnosis not present

## 2022-07-18 NOTE — Telephone Encounter (Signed)
done

## 2022-07-18 NOTE — Telephone Encounter (Signed)
Pt called and is at her pcp office now and they are saying they did not get the diabetic shoe forms that were refaxed. Can you please refax them to [phone].

## 2022-07-21 ENCOUNTER — Telehealth: Payer: Self-pay | Admitting: Podiatry

## 2022-07-21 NOTE — Telephone Encounter (Signed)
Left message on vm to call back to schedule appt to pick up diabetic shoes  -  pts authorization expires 07/22/22 . Needs to come in today or we will have to restart paperwork.

## 2022-07-26 ENCOUNTER — Ambulatory Visit (INDEPENDENT_AMBULATORY_CARE_PROVIDER_SITE_OTHER): Payer: Medicare Other | Admitting: *Deleted

## 2022-07-26 DIAGNOSIS — E119 Type 2 diabetes mellitus without complications: Secondary | ICD-10-CM

## 2022-07-26 DIAGNOSIS — M2142 Flat foot [pes planus] (acquired), left foot: Secondary | ICD-10-CM

## 2022-07-26 DIAGNOSIS — M2141 Flat foot [pes planus] (acquired), right foot: Secondary | ICD-10-CM

## 2022-07-26 NOTE — Progress Notes (Signed)
Patient presents today to pick up diabetic shoes and insoles.  Patient was dispensed 1 pair of diabetic shoes and 3 pairs of foam casted diabetic insoles. Fit was satisfactory. Instructions for break-in and wear was reviewed and a copy was given to the patient.   Re-appointment for regularly scheduled diabetic foot care visits or if they should experience any trouble with the shoes or insoles.  

## 2022-08-01 DIAGNOSIS — E559 Vitamin D deficiency, unspecified: Secondary | ICD-10-CM | POA: Diagnosis not present

## 2022-08-01 DIAGNOSIS — K7469 Other cirrhosis of liver: Secondary | ICD-10-CM | POA: Diagnosis not present

## 2022-08-01 DIAGNOSIS — I10 Essential (primary) hypertension: Secondary | ICD-10-CM | POA: Diagnosis not present

## 2022-08-01 DIAGNOSIS — E0590 Thyrotoxicosis, unspecified without thyrotoxic crisis or storm: Secondary | ICD-10-CM | POA: Diagnosis not present

## 2022-08-01 DIAGNOSIS — E1169 Type 2 diabetes mellitus with other specified complication: Secondary | ICD-10-CM | POA: Diagnosis not present

## 2022-08-22 ENCOUNTER — Other Ambulatory Visit: Payer: Self-pay | Admitting: Family Medicine

## 2022-08-22 DIAGNOSIS — E0590 Thyrotoxicosis, unspecified without thyrotoxic crisis or storm: Secondary | ICD-10-CM

## 2022-08-25 DIAGNOSIS — K754 Autoimmune hepatitis: Secondary | ICD-10-CM | POA: Diagnosis not present

## 2022-08-25 DIAGNOSIS — Z86010 Personal history of colonic polyps: Secondary | ICD-10-CM | POA: Diagnosis not present

## 2022-08-25 DIAGNOSIS — K7469 Other cirrhosis of liver: Secondary | ICD-10-CM | POA: Diagnosis not present

## 2022-09-19 ENCOUNTER — Ambulatory Visit (INDEPENDENT_AMBULATORY_CARE_PROVIDER_SITE_OTHER): Payer: Medicare Other | Admitting: Podiatry

## 2022-09-19 ENCOUNTER — Encounter: Payer: Self-pay | Admitting: Podiatry

## 2022-09-19 VITALS — BP 115/63

## 2022-09-19 DIAGNOSIS — M79674 Pain in right toe(s): Secondary | ICD-10-CM

## 2022-09-19 DIAGNOSIS — M79675 Pain in left toe(s): Secondary | ICD-10-CM

## 2022-09-19 DIAGNOSIS — E119 Type 2 diabetes mellitus without complications: Secondary | ICD-10-CM

## 2022-09-19 DIAGNOSIS — B351 Tinea unguium: Secondary | ICD-10-CM | POA: Diagnosis not present

## 2022-09-19 NOTE — Progress Notes (Signed)
  Subjective:  Patient ID: Maria Fisher, female    DOB: Jun 13, 1956,  MRN: [id number]  Maria Fisher presents to clinic today for preventative diabetic foot care and painful thick toenails that are difficult to trim. Pain interferes with ambulation. Aggravating factors include wearing enclosed shoe gear. Pain is relieved with periodic professional debridement.  Chief Complaint  Patient presents with   Nail Problem    DFC BS-did not check today A1C-7.? PCP-Hagler,Rachel PCP VST-07/2022   New problem(s): None.   PCP is Caren Macadam, MD.  Allergies  Allergen Reactions   Dapagliflozin Nausea And Vomiting    Review of Systems: Negative except as noted in the HPI.  Objective: No changes noted in today's physical examination. Vitals:   09/19/22 1036  BP: 115/63   Maria Fisher is a pleasant 67 y.o. female obese in NAD. AAO x 3.  Vascular Examination: Vascular status intact b/l with palpable pedal pulses. Pedal hair present b/l. CFT immediate b/l. No edema. No pain with calf compression b/l. Skin temperature gradient WNL b/l.   Neurological Examination: Sensation grossly intact b/l with 10 gram monofilament. Vibratory sensation intact b/l.   Dermatological Examination: Pedal skin with normal turgor, texture and tone b/l. Toenails 1-5 b/l thick, discolored, elongated with subungual debris and pain on dorsal palpation. No hyperkeratotic lesions noted b/l.   Musculoskeletal Examination: Muscle strength 5/5 to b/l LE. No pain, crepitus or joint limitation noted with ROM bilateral LE. Pes planus deformity noted bilateral LE.  Radiographs: None  Assessment/Plan: 1. Pain due to onychomycosis of toenails of both feet   2. Type 2 diabetes mellitus without complication, without long-term current use of insulin ([place])     No orders of the defined types were placed in this encounter.   -Consent given for treatment as described below: -Examined patient. -Patient to continue soft,  supportive shoe gear daily. -Mycotic toenails 1-5 bilaterally were debrided in length and girth with sterile nail nippers and dremel without incident. -Patient/POA to call should there be question/concern in the interim.   Return in about 3 months (around 12/19/2022).  Marzetta Board, DPM

## 2022-09-20 ENCOUNTER — Ambulatory Visit
Admission: RE | Admit: 2022-09-20 | Discharge: 2022-09-20 | Disposition: A | Discharge status: Home / Self Care | Payer: Medicare Other | Source: Ambulatory Visit | Attending: Family Medicine | Admitting: Family Medicine

## 2022-09-20 DIAGNOSIS — E0590 Thyrotoxicosis, unspecified without thyrotoxic crisis or storm: Secondary | ICD-10-CM | POA: Diagnosis not present

## 2022-09-20 DIAGNOSIS — E041 Nontoxic single thyroid nodule: Secondary | ICD-10-CM | POA: Diagnosis not present

## 2022-10-10 DIAGNOSIS — E119 Type 2 diabetes mellitus without complications: Secondary | ICD-10-CM | POA: Diagnosis not present

## 2022-10-10 DIAGNOSIS — H2513 Age-related nuclear cataract, bilateral: Secondary | ICD-10-CM | POA: Diagnosis not present

## 2022-10-10 DIAGNOSIS — Z7984 Long term (current) use of oral hypoglycemic drugs: Secondary | ICD-10-CM | POA: Diagnosis not present

## 2022-12-08 DIAGNOSIS — E1169 Type 2 diabetes mellitus with other specified complication: Secondary | ICD-10-CM | POA: Diagnosis not present

## 2022-12-08 DIAGNOSIS — I10 Essential (primary) hypertension: Secondary | ICD-10-CM | POA: Diagnosis not present

## 2022-12-08 DIAGNOSIS — E0590 Thyrotoxicosis, unspecified without thyrotoxic crisis or storm: Secondary | ICD-10-CM | POA: Diagnosis not present

## 2022-12-08 DIAGNOSIS — K7460 Unspecified cirrhosis of liver: Secondary | ICD-10-CM | POA: Diagnosis not present

## 2022-12-08 DIAGNOSIS — E119 Type 2 diabetes mellitus without complications: Secondary | ICD-10-CM | POA: Diagnosis not present

## 2022-12-14 DIAGNOSIS — M25571 Pain in right ankle and joints of right foot: Secondary | ICD-10-CM | POA: Diagnosis not present

## 2022-12-14 DIAGNOSIS — I499 Cardiac arrhythmia, unspecified: Secondary | ICD-10-CM | POA: Diagnosis not present

## 2022-12-27 ENCOUNTER — Ambulatory Visit (INDEPENDENT_AMBULATORY_CARE_PROVIDER_SITE_OTHER): Payer: Medicare Other | Admitting: Podiatry

## 2022-12-27 DIAGNOSIS — M79675 Pain in left toe(s): Secondary | ICD-10-CM

## 2022-12-27 DIAGNOSIS — M2142 Flat foot [pes planus] (acquired), left foot: Secondary | ICD-10-CM

## 2022-12-27 DIAGNOSIS — B351 Tinea unguium: Secondary | ICD-10-CM

## 2022-12-27 DIAGNOSIS — M79674 Pain in right toe(s): Secondary | ICD-10-CM | POA: Diagnosis not present

## 2022-12-27 DIAGNOSIS — E119 Type 2 diabetes mellitus without complications: Secondary | ICD-10-CM | POA: Diagnosis not present

## 2022-12-27 DIAGNOSIS — M2141 Flat foot [pes planus] (acquired), right foot: Secondary | ICD-10-CM

## 2022-12-27 NOTE — Progress Notes (Signed)
  Subjective:  Patient ID: Maria Fisher, female    DOB: 1956-01-19,  MRN: [id number]  Maria Fisher presents to clinic today for preventative diabetic foot care and painful thick toenails that are difficult to trim. Pain interferes with ambulation. Aggravating factors include wearing enclosed shoe gear. Pain is relieved with periodic professional debridement.  Chief Complaint  Patient presents with   Nail Problem    Nail trim    New problem(s): None.   PCP is Aliene Beams, MD.  Allergies  Allergen Reactions   Dapagliflozin Nausea And Vomiting    Review of Systems: Negative except as noted in the HPI.  Objective: No changes noted in today's physical examination. There were no vitals filed for this visit.  Maria Fisher is a pleasant 67 y.o. female obese in NAD. AAO x 3.  Vascular Examination: Vascular status intact b/l with palpable pedal pulses. Pedal hair present b/l. CFT immediate b/l. No edema. No pain with calf compression b/l. Skin temperature gradient WNL b/l.   Neurological Examination: Sensation grossly intact b/l with 10 gram monofilament. Vibratory sensation intact b/l.   Dermatological Examination: Pedal skin with normal turgor, texture and tone b/l. Toenails 1-5 b/l thick, discolored, elongated with subungual debris and pain on dorsal palpation. No hyperkeratotic lesions noted b/l.   Musculoskeletal Examination: Muscle strength 5/5 to b/l LE. No pain, crepitus or joint limitation noted with ROM bilateral LE. Pes planus deformity noted bilateral LE.  Radiographs: None  Assessment/Plan: No diagnosis found.   No orders of the defined types were placed in this encounter.   -Consent given for treatment as described below: -Examined patient. -Patient to continue soft, supportive shoe gear daily. -Mycotic toenails 1-5 bilaterally were debrided in length and girth with sterile nail nippers and dremel without incident. -Patient/POA to call should there be  question/concern in the interim.   No follow-ups on file.  Candelaria Stagers, DPM

## 2023-01-02 DIAGNOSIS — K754 Autoimmune hepatitis: Secondary | ICD-10-CM | POA: Diagnosis not present

## 2023-01-02 DIAGNOSIS — Z0000 Encounter for general adult medical examination without abnormal findings: Secondary | ICD-10-CM | POA: Diagnosis not present

## 2023-01-02 DIAGNOSIS — Z79899 Other long term (current) drug therapy: Secondary | ICD-10-CM | POA: Diagnosis not present

## 2023-01-02 DIAGNOSIS — E1165 Type 2 diabetes mellitus with hyperglycemia: Secondary | ICD-10-CM | POA: Diagnosis not present

## 2023-01-02 DIAGNOSIS — Z9181 History of falling: Secondary | ICD-10-CM | POA: Diagnosis not present

## 2023-01-02 DIAGNOSIS — I499 Cardiac arrhythmia, unspecified: Secondary | ICD-10-CM | POA: Diagnosis not present

## 2023-01-02 DIAGNOSIS — E559 Vitamin D deficiency, unspecified: Secondary | ICD-10-CM | POA: Diagnosis not present

## 2023-01-02 DIAGNOSIS — M8589 Other specified disorders of bone density and structure, multiple sites: Secondary | ICD-10-CM | POA: Diagnosis not present

## 2023-01-02 DIAGNOSIS — I129 Hypertensive chronic kidney disease with stage 1 through stage 4 chronic kidney disease, or unspecified chronic kidney disease: Secondary | ICD-10-CM | POA: Diagnosis not present

## 2023-01-02 DIAGNOSIS — E782 Mixed hyperlipidemia: Secondary | ICD-10-CM | POA: Diagnosis not present

## 2023-01-02 DIAGNOSIS — I10 Essential (primary) hypertension: Secondary | ICD-10-CM | POA: Diagnosis not present

## 2023-01-02 DIAGNOSIS — E1122 Type 2 diabetes mellitus with diabetic chronic kidney disease: Secondary | ICD-10-CM | POA: Diagnosis not present

## 2023-01-02 DIAGNOSIS — N1831 Chronic kidney disease, stage 3a: Secondary | ICD-10-CM | POA: Diagnosis not present

## 2023-01-08 ENCOUNTER — Encounter: Payer: Self-pay | Admitting: Internal Medicine

## 2023-01-08 ENCOUNTER — Ambulatory Visit: Payer: Medicare Other | Attending: Internal Medicine | Admitting: Internal Medicine

## 2023-01-08 VITALS — BP 152/78 | HR 62 | Ht 59.0 in | Wt 198.8 lb

## 2023-01-08 DIAGNOSIS — R002 Palpitations: Secondary | ICD-10-CM

## 2023-01-08 DIAGNOSIS — R011 Cardiac murmur, unspecified: Secondary | ICD-10-CM | POA: Insufficient documentation

## 2023-01-08 NOTE — Progress Notes (Signed)
Cardiology Office Note  Date: 01/08/2023   ID: Maria Fisher, DOB 25-Apr-1956, MRN [id number]  PCP:  Aliene Beams, MD  Cardiologist:  Marjo Bicker, MD Electrophysiologist:  None   Reason for Office Visit: Evaluation of palpitations at the request of Dr. Tracie Harrier   History of Present Illness: Maria Fisher is a 67 y.o. female known to have HTN DM 2, HLD was referred to cardiology clinic for evaluation of palpitations.  Patient was having palpitations lasting for a few seconds, 2-3 times per week, occurs in the morning after she drinks coffee.  Otherwise, she did not notice any palpitations outside of this window.  Denies dizziness, syncope, leg swelling, angina or DOE.  She is currently on Ozempic, lost almost 10 to 12 pounds.  Past Medical History:  Diagnosis Date   Back pain    Diabetes mellitus without complication (HCC)    Heart murmur    Hyperlipidemia    Hypertension    Neck pain    Neuromuscular disorder (HCC)    Shoulder pain, bilateral    Thyroid nodule     Past Surgical History:  Procedure Laterality Date   ABDOMINAL HYSTERECTOMY     BIOPSY  03/14/2022   Procedure: BIOPSY;  Surgeon: Kerin Salen, MD;  Location: [facility];  Service: Gastroenterology;;   CESAREAN SECTION     x 2   CHOLECYSTECTOMY     COLONOSCOPY WITH PROPOFOL N/A 03/14/2022   Procedure: COLONOSCOPY WITH PROPOFOL;  Surgeon: Kerin Salen, MD;  Location: [facility];  Service: Gastroenterology;  Laterality: N/A;   ESOPHAGOGASTRODUODENOSCOPY (EGD) WITH PROPOFOL N/A 03/14/2022   Procedure: ESOPHAGOGASTRODUODENOSCOPY (EGD) WITH PROPOFOL;  Surgeon: Kerin Salen, MD;  Location: [facility];  Service: Gastroenterology;  Laterality: N/A;   POLYPECTOMY  03/14/2022   Procedure: POLYPECTOMY;  Surgeon: Kerin Salen, MD;  Location: [facility];  Service: Gastroenterology;;    Current Outpatient Medications  Medication Sig Dispense Refill   aspirin EC 81 MG tablet Take 81 mg by mouth in the morning.      atorvastatin (LIPITOR) 40 MG tablet Take 40 mg by mouth at bedtime.     azaTHIOprine (IMURAN) 50 MG tablet Take 50 mg by mouth in the morning.     blood glucose meter kit and supplies KIT Dispense based on patient and insurance preference.lancets to fit device. 1 each 0   glipiZIDE (GLUCOTROL XL) 5 MG 24 hr tablet Take 5 mg by mouth every morning.     glucose blood (CONTOUR NEXT TEST) test strip 1 each by Other route 2 (two) times daily. Use as instructed 100 each 3   lisinopril-hydrochlorothiazide (ZESTORETIC) 20-12.5 MG tablet Take 1 tablet by mouth daily.     metFORMIN (GLUCOPHAGE) 1000 MG tablet Take 1 tablet (1,000 mg total) by mouth 2 (two) times daily with a meal. 180 tablet 0   OZEMPIC, 0.25 OR 0.5 MG/DOSE, 2 MG/3ML SOPN Inject 0.5 mg into the skin every Tuesday.     traZODone (DESYREL) 100 MG tablet Take 100 mg by mouth at bedtime as needed for sleep.     Vitamin D, Ergocalciferol, (DRISDOL) 1.25 MG (50000 UNIT) CAPS capsule Take 50,000 Units by mouth once a week.     No current facility-administered medications for this visit.   Allergies:  Dapagliflozin   Social History: The patient  reports that she has never smoked. She has never used smokeless tobacco. She reports that she does not drink alcohol and does not use drugs.   Family History: The  patient's family history includes Alcohol abuse in her father; Cirrhosis in her father; Depression in her mother; Diabetes in her mother and sister; Heart disease in her mother; Hyperlipidemia in her mother; Hypertension in her brother, daughter, mother, sister, and son; Kidney disease in her mother.   ROS:  Please see the history of present illness. Otherwise, complete review of systems is positive for none  All other systems are reviewed and negative.   Physical Exam: VS:  BP (!) 152/78   Pulse 62   Ht 4\' 11"  (1.499 m)   Wt 198 lb 12.8 oz (90.2 kg)   SpO2 99%   BMI 40.15 kg/m , BMI Body mass index is 40.15 kg/m.  Wt Readings from  Last 3 Encounters:  01/08/23 198 lb 12.8 oz (90.2 kg)  03/14/22 200 lb (90.7 kg)  12/26/21 190 lb (86.2 kg)    General: Patient appears comfortable at rest. HEENT: Conjunctiva and lids normal, oropharynx clear with moist mucosa. Neck: Supple, no elevated JVP or carotid bruits, no thyromegaly. Lungs: Clear to auscultation, nonlabored breathing at rest. Cardiac: Regular rate and rhythm, systolic murmur Abdomen: Soft, nontender, no hepatomegaly, bowel sounds present, no guarding or rebound. Extremities: No pitting edema, distal pulses 2+. Skin: Warm and dry. Musculoskeletal: No kyphosis. Neuropsychiatric: Alert and oriented x3, affect grossly appropriate.  Recent Labwork: No results found for requested labs within last 365 days.     Component Value Date/Time   CHOL 107 11/08/2017 0956   TRIG 77 11/08/2017 0956   HDL 47 (L) 11/08/2017 0956   CHOLHDL 2.3 11/08/2017 0956   LDLCALC 44 11/08/2017 0956    Other Studies Reviewed Today:   Assessment and Plan: Patient is a 67 year old F known to have HTN, DM 2 was referred to cardiology clinic for evaluation of palpitations.  # Palpitations likely secondary to coffee use: Instructed patient to switch to decaffeinated coffee and evaluate palpitations.  If she continues to have palpitations despite avoiding caffeine, she will need to call the clinic for 2-week event monitor.  # HTN, partially controlled: Continue lisinopril-HCTZ 20-12.5 mg once daily, HTN management per PCP.  She is also on Ozempic which will decrease her blood pressure through weight loss eventually.  # Systolic murmur Grade 2-3/6: Likely innocent murmur or trivial regurgitation.  Will obtain 2D echocardiogram.  # Type II DM: Continue metformin 1000 mg twice daily and Ozempic, management per PCP.  # HLD: Continue atorvastatin 40 mg nightly, goal LDL less than 100.   I have spent a total of 45 minutes with patient reviewing chart, EKGs, labs and examining patient as  well as establishing an assessment and plan that was discussed with the patient.  > 50% of time was spent in direct patient care.    Medication Adjustments/Labs and Tests Ordered: Current medicines are reviewed at length with the patient today.  Concerns regarding medicines are outlined above.   Tests Ordered: Orders Placed This Encounter  Procedures   ECHOCARDIOGRAM COMPLETE    Medication Changes: No orders of the defined types were placed in this encounter.   Disposition:  Follow up  PRN  Signed  Verne Spurr, MD, 01/08/2023 4:16 PM    [facility] [address] [place], [place], [place] [address]

## 2023-01-08 NOTE — Patient Instructions (Addendum)
Medication Instructions:  Your physician recommends that you continue on your current medications as directed. Please refer to the Current Medication list given to you today.  Labwork: none  Testing/Procedures: Your physician has requested that you have an echocardiogram. Echocardiography is a painless test that uses sound waves to create images of your heart. It provides your doctor with information about the size and shape of your heart and how well your heart's chambers and valves are working. This procedure takes approximately one hour. There are no restrictions for this procedure. Please do NOT wear cologne, perfume, aftershave, or lotions (deodorant is allowed). Please arrive 15 minutes prior to your appointment time.  Follow-Up: Your physician recommends that you schedule a follow-up appointment in: pending  Any Other Special Instructions Will Be Listed Below (If Applicable). Please change to decaf coffee. Please contact our office if your palpitations don't improve to have a 2 week ZIO XT heart monitor arranged.  If you need a refill on your cardiac medications before your next appointment, please call your pharmacy.

## 2023-01-12 ENCOUNTER — Emergency Department (HOSPITAL_COMMUNITY): Payer: Medicare Other

## 2023-01-12 ENCOUNTER — Other Ambulatory Visit: Payer: Self-pay

## 2023-01-12 ENCOUNTER — Emergency Department (HOSPITAL_COMMUNITY)
Admission: EM | Admit: 2023-01-12 | Discharge: 2023-01-12 | Disposition: A | Discharge status: Home / Self Care | Payer: Medicare Other | Attending: Emergency Medicine | Admitting: Emergency Medicine

## 2023-01-12 ENCOUNTER — Encounter (HOSPITAL_COMMUNITY): Payer: Self-pay

## 2023-01-12 DIAGNOSIS — G43809 Other migraine, not intractable, without status migrainosus: Secondary | ICD-10-CM

## 2023-01-12 DIAGNOSIS — Z7982 Long term (current) use of aspirin: Secondary | ICD-10-CM | POA: Insufficient documentation

## 2023-01-12 DIAGNOSIS — Z7984 Long term (current) use of oral hypoglycemic drugs: Secondary | ICD-10-CM | POA: Diagnosis not present

## 2023-01-12 DIAGNOSIS — R2990 Unspecified symptoms and signs involving the nervous system: Secondary | ICD-10-CM

## 2023-01-12 DIAGNOSIS — I10 Essential (primary) hypertension: Secondary | ICD-10-CM | POA: Diagnosis not present

## 2023-01-12 DIAGNOSIS — Z79899 Other long term (current) drug therapy: Secondary | ICD-10-CM | POA: Insufficient documentation

## 2023-01-12 DIAGNOSIS — E119 Type 2 diabetes mellitus without complications: Secondary | ICD-10-CM | POA: Diagnosis not present

## 2023-01-12 DIAGNOSIS — E785 Hyperlipidemia, unspecified: Secondary | ICD-10-CM | POA: Insufficient documentation

## 2023-01-12 DIAGNOSIS — R29810 Facial weakness: Secondary | ICD-10-CM | POA: Diagnosis not present

## 2023-01-12 DIAGNOSIS — R531 Weakness: Secondary | ICD-10-CM | POA: Diagnosis not present

## 2023-01-12 DIAGNOSIS — Z0389 Encounter for observation for other suspected diseases and conditions ruled out: Secondary | ICD-10-CM | POA: Diagnosis not present

## 2023-01-12 DIAGNOSIS — G43909 Migraine, unspecified, not intractable, without status migrainosus: Secondary | ICD-10-CM | POA: Diagnosis not present

## 2023-01-12 LAB — CBG MONITORING, ED
Glucose-Capillary: 137 mg/dL — ABNORMAL HIGH (ref 70–99)
Glucose-Capillary: 139 mg/dL — ABNORMAL HIGH (ref 70–99)

## 2023-01-12 LAB — COMPREHENSIVE METABOLIC PANEL
ALT: 18 U/L (ref 0–44)
AST: 21 U/L (ref 15–41)
Albumin: 3.7 g/dL (ref 3.5–5.0)
Alkaline Phosphatase: 69 U/L (ref 38–126)
Anion gap: 10 (ref 5–15)
BUN: 19 mg/dL (ref 8–23)
CO2: 25 mmol/L (ref 22–32)
Calcium: 9.6 mg/dL (ref 8.9–10.3)
Chloride: 102 mmol/L (ref 98–111)
Creatinine, Ser: 1.13 mg/dL — ABNORMAL HIGH (ref 0.44–1.00)
GFR, Estimated: 53 mL/min — ABNORMAL LOW (ref >60)
Glucose, Bld: 123 mg/dL — ABNORMAL HIGH (ref 70–99)
Potassium: 3.3 mmol/L — ABNORMAL LOW (ref 3.5–5.1)
Sodium: 137 mmol/L (ref 135–145)
Total Bilirubin: 0.8 mg/dL (ref 0.3–1.2)
Total Protein: 8.6 g/dL — ABNORMAL HIGH (ref 6.5–8.1)

## 2023-01-12 LAB — CBC
HCT: 39.7 % (ref 36.0–46.0)
Hemoglobin: 13.1 g/dL (ref 12.0–15.0)
MCH: 28.5 pg (ref 26.0–34.0)
MCHC: 33 g/dL (ref 30.0–36.0)
MCV: 86.3 fL (ref 80.0–100.0)
Platelets: 238 10*3/uL (ref 150–400)
RBC: 4.6 MIL/uL (ref 3.87–5.11)
RDW: 12.4 % (ref 11.5–15.5)
WBC: 6.9 10*3/uL (ref 4.0–10.5)
nRBC: 0 % (ref 0.0–0.2)

## 2023-01-12 LAB — RAPID URINE DRUG SCREEN, HOSP PERFORMED
Amphetamines: NOT DETECTED
Barbiturates: NOT DETECTED
Benzodiazepines: NOT DETECTED
Cocaine: NOT DETECTED
Opiates: NOT DETECTED
Tetrahydrocannabinol: NOT DETECTED

## 2023-01-12 LAB — URINALYSIS, ROUTINE W REFLEX MICROSCOPIC
Bilirubin Urine: NEGATIVE
Glucose, UA: NEGATIVE mg/dL
Hgb urine dipstick: NEGATIVE
Ketones, ur: NEGATIVE mg/dL
Leukocytes,Ua: NEGATIVE
Nitrite: NEGATIVE
Protein, ur: NEGATIVE mg/dL
Specific Gravity, Urine: 1.012 (ref 1.005–1.030)
pH: 5 (ref 5.0–8.0)

## 2023-01-12 LAB — DIFFERENTIAL
Abs Immature Granulocytes: 0.02 10*3/uL (ref 0.00–0.07)
Basophils Absolute: 0 10*3/uL (ref 0.0–0.1)
Basophils Relative: 0 %
Eosinophils Absolute: 0 10*3/uL (ref 0.0–0.5)
Eosinophils Relative: 0 %
Immature Granulocytes: 0 %
Lymphocytes Relative: 39 %
Lymphs Abs: 2.7 10*3/uL (ref 0.7–4.0)
Monocytes Absolute: 0.6 10*3/uL (ref 0.1–1.0)
Monocytes Relative: 8 %
Neutro Abs: 3.6 10*3/uL (ref 1.7–7.7)
Neutrophils Relative %: 53 %

## 2023-01-12 LAB — PROTIME-INR
INR: 1 (ref 0.8–1.2)
Prothrombin Time: 13.5 seconds (ref 11.4–15.2)

## 2023-01-12 LAB — APTT: aPTT: 26 seconds (ref 24–36)

## 2023-01-12 LAB — ETHANOL: Alcohol, Ethyl (B): <10 mg/dL (ref <10)

## 2023-01-12 MED ORDER — DIAZEPAM 5 MG/ML IJ SOLN
2.5000 mg | Freq: Once | INTRAMUSCULAR | Status: AC | PRN
  Administered 2023-01-12: 2.5 mg via INTRAVENOUS
  Filled 2023-01-12: qty 2

## 2023-01-12 MED ORDER — MORPHINE SULFATE (PF) 4 MG/ML IV SOLN
4.0000 mg | Freq: Once | INTRAVENOUS | Status: AC
  Administered 2023-01-12: 4 mg via INTRAVENOUS
  Filled 2023-01-12: qty 1

## 2023-01-12 MED ORDER — ONDANSETRON HCL 4 MG/2ML IJ SOLN
4.0000 mg | Freq: Once | INTRAMUSCULAR | Status: AC
  Administered 2023-01-12: 4 mg via INTRAVENOUS
  Filled 2023-01-12: qty 2

## 2023-01-12 NOTE — ED Provider Notes (Signed)
[facility]GENCY DEPARTMENT AT [facility] Provider Note   CSN: [id number] Arrival date & time: 01/12/23  1557  An emergency department physician performed an initial assessment on this suspected stroke patient at 1612.  History {Add pertinent medical, surgical, social history, OB history to HPI:1} Chief Complaint  Patient presents with   Facial Droop    Maria Fisher is a 67 y.o. female.  Patient has a history of hypertension diabetes and hyperlipidemia.  She started around 330 with right-sided facial droop.  A code stroke was called  The history is provided by the patient and medical records. No language interpreter was used.  Weakness Severity:  Mild Onset quality:  Sudden Timing:  Constant Progression:  Waxing and waning Chronicity:  New Context: not alcohol use   Ineffective treatments:  None tried Associated symptoms: no abdominal pain, no chest pain, no cough, no diarrhea, no frequency, no headaches and no seizures   Risk factors: no anemia        Home Medications Prior to Admission medications   Medication Sig Start Date End Date Taking? Authorizing Provider  Cholecalciferol (VITAMIN D3) 50 MCG (2000 UT) capsule 2 capsules Orally Once a day 01/04/23  Yes [provider]  cyanocobalamin (VITAMIN B12) 500 MCG tablet 1 tablet Orally Once a day for 30 day(s) 01/04/23  Yes [provider]  lisinopril-hydrochlorothiazide (ZESTORETIC) 10-12.5 MG tablet Take 1 tablet by mouth daily. 12/20/22  Yes [provider]  predniSONE (DELTASONE) 10 MG tablet Take by mouth. 12/14/22  Yes [provider]  aspirin EC 81 MG tablet Take 81 mg by mouth in the morning.    [provider]  atorvastatin (LIPITOR) 40 MG tablet Take 40 mg by mouth at bedtime. 09/14/17   [provider]  azaTHIOprine (IMURAN) 50 MG tablet Take 50 mg by mouth in the morning. 01/16/22   [provider]  blood glucose meter kit and supplies KIT  Dispense based on patient and insurance preference.lancets to fit device. 05/27/18   Aliene Beams, MD  glipiZIDE (GLUCOTROL XL) 5 MG 24 hr tablet Take 5 mg by mouth every morning. 05/23/22   [provider]  glucose blood (CONTOUR NEXT TEST) test strip 1 each by Other route 2 (two) times daily. Use as instructed 05/27/18   Aliene Beams, MD  lisinopril-hydrochlorothiazide (ZESTORETIC) 20-12.5 MG tablet Take 1 tablet by mouth daily. 12/23/21   [provider]  metFORMIN (GLUCOPHAGE) 1000 MG tablet Take 1 tablet (1,000 mg total) by mouth 2 (two) times daily with a meal. 05/27/18   Aliene Beams, MD  OZEMPIC, 0.25 OR 0.5 MG/DOSE, 2 MG/3ML SOPN Inject 0.5 mg into the skin every Tuesday. 12/28/21   [provider]  traZODone (DESYREL) 100 MG tablet Take 100 mg by mouth at bedtime as needed for sleep. 12/23/21   [provider]  Vitamin D, Ergocalciferol, (DRISDOL) 1.25 MG (50000 UNIT) CAPS capsule Take 50,000 Units by mouth once a week. 02/21/22   [provider]      Allergies    Dapagliflozin    Review of Systems   Review of Systems  Constitutional:  Negative for appetite change and fatigue.  HENT:  Negative for congestion, ear discharge and sinus pressure.   Eyes:  Negative for discharge.  Respiratory:  Negative for cough.   Cardiovascular:  Negative for chest pain.  Gastrointestinal:  Negative for abdominal pain and diarrhea.  Genitourinary:  Negative for frequency and hematuria.  Musculoskeletal:  Negative for back  pain.  Skin:  Negative for rash.  Neurological:  Positive for weakness. Negative for seizures and headaches.       Right facial weakness  Psychiatric/Behavioral:  Negative for hallucinations.     Physical Exam Updated Vital Signs BP (!) 161/87 (BP Location: Left Arm)   Pulse 97   Temp 99.6 F (37.6 C) (Oral)   Resp 17   Ht 4\' 11"  (1.499 m)   Wt 88.5 kg   SpO2 100%   BMI 39.39 kg/m  Physical Exam Vitals and nursing note  reviewed.  Constitutional:      Appearance: She is well-developed.  HENT:     Head: Normocephalic.     Nose: Nose normal.  Eyes:     General: No scleral icterus.    Conjunctiva/sclera: Conjunctivae normal.  Neck:     Thyroid: No thyromegaly.  Cardiovascular:     Rate and Rhythm: Normal rate and regular rhythm.     Heart sounds: No murmur heard.    No friction rub. No gallop.  Pulmonary:     Breath sounds: No stridor. No wheezing or rales.  Chest:     Chest wall: No tenderness.  Abdominal:     General: There is no distension.     Tenderness: There is no abdominal tenderness. There is no rebound.  Musculoskeletal:        General: Normal range of motion.     Cervical back: Neck supple.  Lymphadenopathy:     Cervical: No cervical adenopathy.  Skin:    Findings: No erythema or rash.  Neurological:     Mental Status: She is alert and oriented to person, place, and time.     Motor: No abnormal muscle tone.     Coordination: Coordination normal.     Comments: Mild right facial weakness  Psychiatric:        Behavior: Behavior normal.     ED Results / Procedures / Treatments   Labs (all labs ordered are listed, but only abnormal results are displayed) Labs Reviewed  COMPREHENSIVE METABOLIC PANEL - Abnormal; Notable for the following components:      Result Value   Potassium 3.3 (*)    Glucose, Bld 123 (*)    Creatinine, Ser 1.13 (*)    Total Protein 8.6 (*)    GFR, Estimated 53 (*)    All other components within normal limits  CBG MONITORING, ED - Abnormal; Notable for the following components:   Glucose-Capillary 137 (*)    All other components within normal limits  CBG MONITORING, ED - Abnormal; Notable for the following components:   Glucose-Capillary 139 (*)    All other components within normal limits  ETHANOL  PROTIME-INR  APTT  CBC  DIFFERENTIAL  RAPID URINE DRUG SCREEN, HOSP PERFORMED  URINALYSIS, ROUTINE W REFLEX MICROSCOPIC  I-STAT CHEM 8, ED     EKG None  Radiology CT HEAD CODE STROKE WO CONTRAST  Result Date: 01/12/2023 CLINICAL DATA:  Code stroke. Left facial droop and generalized weakness EXAM: CT HEAD WITHOUT CONTRAST TECHNIQUE: Contiguous axial images were obtained from the base of the skull through the vertex without intravenous contrast. RADIATION DOSE REDUCTION: This exam was performed according to the departmental dose-optimization program which includes automated exposure control, adjustment of the mA and/or kV according to patient size and/or use of iterative reconstruction technique. COMPARISON:  None Available. FINDINGS: Brain: No evidence of acute infarction, hemorrhage, hydrocephalus, extra-axial collection or mass lesion/mass effect. Vascular: No hyperdense vessel or unexpected calcification. Skull:  Normal. Negative for fracture or focal lesion. Sinuses/Orbits: No middle ear or mastoid effusion. Mucosal thickening right sphenoid sinus. Orbits are unremarkable. Other: None. ASPECTS [facility] Stroke Program Early CT Score): 10 IMPRESSION: No hemorrhage or CT evidence of an acute infarct.  Aspects is 10. Discussed with Dr. Estell Harpin on 01/12/23 at 4:26 PM. Electronically Signed   By: Lorenza Cambridge M.D.   On: 01/12/2023 16:26    Procedures Procedures  {Document cardiac monitor, telemetry assessment procedure when appropriate:1}  Medications Ordered in ED Medications - No data to display  ED Course/ Medical Decision Making/ A&P  CRITICAL CARE Performed by: Bethann Berkshire Total critical care time: 40 minutes Critical care time was exclusive of separately billable procedures and treating other patients. Critical care was necessary to treat or prevent imminent or life-threatening deterioration. Critical care was time spent personally by me on the following activities: development of treatment plan with patient and/or surrogate as well as nursing, discussions with consultants, evaluation of patient's response to treatment,  examination of patient, obtaining history from patient or surrogate, ordering and performing treatments and interventions, ordering and review of laboratory studies, ordering and review of radiographic studies, pulse oximetry and re-evaluation of patient's condition.     Patient was seen by Dr. Pearlean Brownie for code stroke.  He states that he feels like the right facial weakness is improving and would not recommend thrombolytics and does not feel like this is a LVO.  He did state that if he got an MRI we could be certain.  He stated we can send the patient to [facility] emergency traffic to get the MRI and if it shows a stroke they could consider giving thrombolytics at that time.  He also states that if her symptoms get worse they can consider thrombolytics at that time.  Patient will be transferred emergency traffic to [facility] emergency department and Dr. Shirlee Limerick is excepting Dr. Deberah Pelton here for ABCD2, HEART and other calculatorsREFRESH Note before signing :1}                          Medical Decision Making Amount and/or Complexity of Data Reviewed Labs: ordered. Radiology: ordered.   Patient with facial droop.  She will be transferred to [facility] for MRI  {Document critical care time when appropriate:1} {Document review of labs and clinical decision tools ie heart score, Chads2Vasc2 etc:1}  {Document your independent review of radiology images, and any outside records:1} {Document your discussion with family members, caretakers, and with consultants:1} {Document social determinants of health affecting pt's care:1} {Document your decision making why or why not admission, treatments were needed:1} Final Clinical Impression(s) / ED Diagnoses Final diagnoses:  Facial droop    Rx / DC Orders ED Discharge Orders     None

## 2023-01-12 NOTE — ED Triage Notes (Signed)
Pt via POV with left facial droop and generalized weakness, able to stand with assistance. Pt's husband states she called him to the bedroom and he found her with facial droop at around 1530pm. Pt is tearful in triage. Pt has cardiac history but no previous CVA.

## 2023-01-12 NOTE — Progress Notes (Signed)
1605 call time 1606 beeper time 1618 exam started 1620 exam finished 1623 exam completed in epic 1623 [facility] radiology called

## 2023-01-12 NOTE — ED Provider Notes (Signed)
Pt presents as a possible CVA from [facility].  She does have a right facial droop, but it's only noticeable when she tries to smile.  She is stable.  Stat MRI ordered.  Films reviewed by me.  I agree with the radiologist.  MRI:  Negative for an acute infarct.   Pt's headache is better and sx have improved.  Pt is stable for d/c.  Return if worse.  F/u with pcp.      Jacalyn Lefevre, MD 01/12/23 2124

## 2023-01-12 NOTE — ED Notes (Signed)
In CT with pt at 1614

## 2023-01-12 NOTE — ED Notes (Signed)
MRI states pt is towards top of the list.

## 2023-01-12 NOTE — ED Notes (Signed)
RN called MRI to let them know pt is here in ED

## 2023-01-12 NOTE — ED Notes (Signed)
Back to room with neuro in progress at 1619

## 2023-01-12 NOTE — ED Triage Notes (Signed)
Pt transferred from [facility] for mri. Lsn 1515. Deficits- facial droop.

## 2023-01-12 NOTE — ED Notes (Signed)
Pt leaving with [facility]. Report given to [facility]. Consulting civil engineer cone

## 2023-01-12 NOTE — ED Notes (Addendum)
Note that when nurse asks pt to stick out tongue, facial droop goes away and pt is able to stick tongue straight out and from side to side, when pt closes mouth, facial droop reappears as lips move back toward left side

## 2023-01-12 NOTE — Progress Notes (Signed)
Telestroke RN Note:  [place] 1607 Dr. Selina Cooley paged 1610 Dr. Pearlean Brownie paged 1612 Pt to CT 1614 Dr. Pearlean Brownie on screen 1615 Pt back from CT 1621  LNW 1500 - pt called her husband into the bedroom at 1530 and had L facial droop at that time mRS 0

## 2023-01-12 NOTE — Progress Notes (Signed)
[facility] Telemedicine Consult   Requesting Provider: Dr Aileen Pilot Consult Participants: Patient, bedside RN, atrium RN Kayla and Dr. Claris Gladden Location of the provider: [facility] Location of the patient: [facility], ER  This consult was provided via telemedicine with 2-way video and audio communication. The patient/family was informed that care would be provided in this way and agreed to receive care in this manner.    Chief Complaint: Facial droop  HPI: Ms. Pedro is a 67 year old African-American lady with past medical history of hypertension, hyperlipidemia, diabetes, back pain, cardiac murmur and thyroid nodule.  She developed sudden onset of left facial droop that was noticed by her husband at 3:30 PM today.  She was brought in as a code stroke.  Patient denies any slurred speech, extremity weakness, numbness, gait or balance difficulties.  When evaluated by teleneurology she appeared to be emotionally labile and needed to be counseled to calm down to complete the exam.  She had subjective pulling sensation on the left side of her face but no objective weakness on testing. Noncontrast CT scan of the head was negative for acute ABNORMALITY   LKW: 1530 tpa given?: No, too mild to treat IR Thrombectomy? No, clinically not suggestive of LVO Modified Rankin Scale: 0-Completely asymptomatic and back to baseline post- stroke Time of teleneurologist evaluation: 1616  Exam:  Vitals:   01/12/23 1612 01/12/23 1625  BP: (!) 161/87   Pulse:  97  Resp:  17  Temp:    SpO2: 99% 100%    General: Pleasant obese middle-aged African-American lady.  She is emotionally labile Patient is awake alert and interactive.  No aphasia apraxia or dysarthria.  Extraocular movements are full range without nystagmus.  Visual fields seem full to bedside confrontation testing.  Face is slightly drawn at rest on the left but when she smiles and shows her teeth there is no weakness and  symmetric movements bilaterally.  Tongue is midline.  Motor system exam shows symmetric upper and lower extremity strength without focal weakness.  Fine finger movements are symmetric.  She does not do any orbiting.  Sensation is intact bilaterally.  Coordination slow but accurate.  Gait not tested.  NIHSS ; 1A: Level of Consciousness -0 1B: Ask Month and Age -24 1C: 'Blink Eyes' & 'Squeeze Hands' -0 2: Test Horizontal Extraocular Movements -0 3: Test Visual Fields -0 4: Test Facial Palsy -0 5A: Test Left Arm Motor Drift -0 5B: Test Right Arm Motor Drift -0 6A: Test Left Leg Motor Drift -0 6B: Test Right Leg Motor Drift -0 7: Test Limb Ataxia -0 8: Test Sensation -0 9: Test Language/Aphasia-0 10: Test Dysarthria -0 11: Test Extinction/Inattention -0 NIHSS score: 0   Imaging Reviewed:  CT head noncontrast study shows no acute abnormality.  Aspect score of 10.  Labs reviewed in epic and pertinent values follow: Pending at this time   Assessment: 67 year old African-American lady with sudden onset of left facial droop which appears to be transient and resolved.  This could represent right brain subcortical/brainstem TIA. Patient is not a candidate for IV thrombolysis due to minimum and resolving deficits. She is not a candidate for thrombectomy as clinical presentation is not compatible with LVO. Recommendations:  Patient is not a candidate for thrombolysis but is within the TNK window ends recommend close neurological monitoring.  If patient neurological deficits worsen reactivate code stroke.   Check MRI scan of the brain and if it is not possible to get it done at [facility] consider transferring ER to ER to [facility] for the same. If MRI is negative for stroke patient can be discharged home on her current dose of aspirin and advised to maintain aggressive risk factor modification. If MRI is positive for stroke patient will need to be admitted to complete stroke restratification  workup and reconsult neurology at that time. Long discussion with patient and answered questions. Discussed with Dr. Estell Harpin   This patient is receiving care for possible acute neurological changes. There was 55 minutes of care by this provider at the time of service, including time for direct evaluation via telemedicine, review of medical records, imaging studies and discussion of findings with providers, the patient and/or family.  Delia Heady, MD [facility] [phone]  If 7pm- 7am, please page neurology on call as listed in AMION.

## 2023-01-15 ENCOUNTER — Ambulatory Visit (INDEPENDENT_AMBULATORY_CARE_PROVIDER_SITE_OTHER): Payer: Medicare Other

## 2023-01-15 DIAGNOSIS — M2141 Flat foot [pes planus] (acquired), right foot: Secondary | ICD-10-CM

## 2023-01-15 DIAGNOSIS — E119 Type 2 diabetes mellitus without complications: Secondary | ICD-10-CM

## 2023-01-15 DIAGNOSIS — M2142 Flat foot [pes planus] (acquired), left foot: Secondary | ICD-10-CM

## 2023-01-15 NOTE — Progress Notes (Signed)
Patient presents to the office today for diabetic shoe and insole measuring.  ABN signed.   Documentation of medical necessity will be sent to patient's treating diabetic doctor to verify and sign.   Patient's diabetic provider: Aliene Beams, MD   Shoes and insoles will be ordered at that time and patient will be notified for an appointment for fitting when they arrive.   Patient shoe selection-   1st   Shoe choice:   [id number]  Shoe size ordered: 8.5xw

## 2023-01-16 NOTE — Addendum Note (Signed)
Addended by: Nicholes Rough on: 01/16/2023 08:04 AM   Modules accepted: Orders

## 2023-01-23 DIAGNOSIS — I10 Essential (primary) hypertension: Secondary | ICD-10-CM | POA: Diagnosis not present

## 2023-01-23 DIAGNOSIS — R519 Headache, unspecified: Secondary | ICD-10-CM | POA: Diagnosis not present

## 2023-02-14 ENCOUNTER — Ambulatory Visit (HOSPITAL_COMMUNITY)
Admission: RE | Admit: 2023-02-14 | Discharge: 2023-02-14 | Disposition: A | Discharge status: Home / Self Care | Payer: Medicare Other | Source: Ambulatory Visit | Attending: Internal Medicine | Admitting: Internal Medicine

## 2023-02-14 DIAGNOSIS — R011 Cardiac murmur, unspecified: Secondary | ICD-10-CM

## 2023-02-14 LAB — ECHOCARDIOGRAM COMPLETE
AR max vel: 1.99 cm2
AV Area VTI: 2.25 cm2
AV Area mean vel: 2.17 cm2
AV Mean grad: 5 mmHg
AV Peak grad: 10.8 mmHg
Ao pk vel: 1.64 m/s
Area-P 1/2: 2.54 cm2
S' Lateral: 2.6 cm

## 2023-02-14 NOTE — Progress Notes (Signed)
*  PRELIMINARY RESULTS* Echocardiogram 2D Echocardiogram has been performed.  Stacey Drain 02/14/2023, 1:39 PM

## 2023-02-15 DIAGNOSIS — K7469 Other cirrhosis of liver: Secondary | ICD-10-CM | POA: Diagnosis not present

## 2023-02-20 ENCOUNTER — Telehealth: Payer: Self-pay

## 2023-02-20 NOTE — Telephone Encounter (Signed)
-----   Message from Marjo Bicker, MD sent at 02/15/2023 12:59 PM EDT ----- Echo showed normal pumping function of the heart and mild tricuspid regurgitation (leakiness across the tricuspid valve).  This is benign and if patient develops any symptoms of DOE in the future, can repeat echocardiogram.

## 2023-02-20 NOTE — Telephone Encounter (Signed)
Called patient to inform her of results of Echo. Left detailed message of results and to call back if there where any further questions.

## 2023-03-28 ENCOUNTER — Ambulatory Visit: Payer: Medicare Other

## 2023-03-28 ENCOUNTER — Ambulatory Visit: Payer: Medicare Other | Admitting: Podiatry

## 2023-03-28 DIAGNOSIS — E119 Type 2 diabetes mellitus without complications: Secondary | ICD-10-CM

## 2023-03-28 DIAGNOSIS — M2142 Flat foot [pes planus] (acquired), left foot: Secondary | ICD-10-CM | POA: Diagnosis not present

## 2023-03-28 DIAGNOSIS — M2141 Flat foot [pes planus] (acquired), right foot: Secondary | ICD-10-CM | POA: Diagnosis not present

## 2023-03-28 NOTE — Progress Notes (Unsigned)

## 2023-04-06 ENCOUNTER — Ambulatory Visit (INDEPENDENT_AMBULATORY_CARE_PROVIDER_SITE_OTHER): Payer: Medicare Other | Admitting: Podiatry

## 2023-04-06 DIAGNOSIS — M79675 Pain in left toe(s): Secondary | ICD-10-CM | POA: Diagnosis not present

## 2023-04-06 DIAGNOSIS — E119 Type 2 diabetes mellitus without complications: Secondary | ICD-10-CM

## 2023-04-06 DIAGNOSIS — M79674 Pain in right toe(s): Secondary | ICD-10-CM

## 2023-04-06 DIAGNOSIS — B351 Tinea unguium: Secondary | ICD-10-CM | POA: Diagnosis not present

## 2023-04-06 NOTE — Progress Notes (Signed)
  Subjective:  Patient ID: Maria Fisher, female    DOB: 1956-01-19,  MRN: [id number]  Winn Jock presents to clinic today for preventative diabetic foot care and painful thick toenails that are difficult to trim. Pain interferes with ambulation. Aggravating factors include wearing enclosed shoe gear. Pain is relieved with periodic professional debridement.  Chief Complaint  Patient presents with   Nail Problem    Nail trim    New problem(s): None.   PCP is Aliene Beams, MD.  Allergies  Allergen Reactions   Dapagliflozin Nausea And Vomiting    Review of Systems: Negative except as noted in the HPI.  Objective: No changes noted in today's physical examination. There were no vitals filed for this visit.  Maria Fisher is a pleasant 67 y.o. female obese in NAD. AAO x 3.  Vascular Examination: Vascular status intact b/l with palpable pedal pulses. Pedal hair present b/l. CFT immediate b/l. No edema. No pain with calf compression b/l. Skin temperature gradient WNL b/l.   Neurological Examination: Sensation grossly intact b/l with 10 gram monofilament. Vibratory sensation intact b/l.   Dermatological Examination: Pedal skin with normal turgor, texture and tone b/l. Toenails 1-5 b/l thick, discolored, elongated with subungual debris and pain on dorsal palpation. No hyperkeratotic lesions noted b/l.   Musculoskeletal Examination: Muscle strength 5/5 to b/l LE. No pain, crepitus or joint limitation noted with ROM bilateral LE. Pes planus deformity noted bilateral LE.  Radiographs: None  Assessment/Plan: No diagnosis found.   No orders of the defined types were placed in this encounter.   -Consent given for treatment as described below: -Examined patient. -Patient to continue soft, supportive shoe gear daily. -Mycotic toenails 1-5 bilaterally were debrided in length and girth with sterile nail nippers and dremel without incident. -Patient/POA to call should there be  question/concern in the interim.   No follow-ups on file.  Candelaria Stagers, DPM

## 2023-04-10 DIAGNOSIS — E559 Vitamin D deficiency, unspecified: Secondary | ICD-10-CM | POA: Diagnosis not present

## 2023-04-10 DIAGNOSIS — E782 Mixed hyperlipidemia: Secondary | ICD-10-CM | POA: Diagnosis not present

## 2023-04-10 DIAGNOSIS — E0590 Thyrotoxicosis, unspecified without thyrotoxic crisis or storm: Secondary | ICD-10-CM | POA: Diagnosis not present

## 2023-04-10 DIAGNOSIS — E042 Nontoxic multinodular goiter: Secondary | ICD-10-CM | POA: Diagnosis not present

## 2023-04-10 DIAGNOSIS — E1165 Type 2 diabetes mellitus with hyperglycemia: Secondary | ICD-10-CM | POA: Diagnosis not present

## 2023-04-10 DIAGNOSIS — I10 Essential (primary) hypertension: Secondary | ICD-10-CM | POA: Diagnosis not present

## 2023-04-10 DIAGNOSIS — K7460 Unspecified cirrhosis of liver: Secondary | ICD-10-CM | POA: Diagnosis not present

## 2023-06-22 DIAGNOSIS — Z23 Encounter for immunization: Secondary | ICD-10-CM | POA: Diagnosis not present

## 2023-07-12 DIAGNOSIS — E1165 Type 2 diabetes mellitus with hyperglycemia: Secondary | ICD-10-CM | POA: Diagnosis not present

## 2023-07-12 DIAGNOSIS — N1831 Chronic kidney disease, stage 3a: Secondary | ICD-10-CM | POA: Diagnosis not present

## 2023-07-12 DIAGNOSIS — E042 Nontoxic multinodular goiter: Secondary | ICD-10-CM | POA: Diagnosis not present

## 2023-07-12 DIAGNOSIS — E0590 Thyrotoxicosis, unspecified without thyrotoxic crisis or storm: Secondary | ICD-10-CM | POA: Diagnosis not present

## 2023-07-13 ENCOUNTER — Ambulatory Visit (INDEPENDENT_AMBULATORY_CARE_PROVIDER_SITE_OTHER): Payer: Medicare Other | Admitting: Podiatry

## 2023-07-13 ENCOUNTER — Encounter: Payer: Self-pay | Admitting: Podiatry

## 2023-07-13 DIAGNOSIS — B351 Tinea unguium: Secondary | ICD-10-CM | POA: Diagnosis not present

## 2023-07-13 DIAGNOSIS — E119 Type 2 diabetes mellitus without complications: Secondary | ICD-10-CM | POA: Diagnosis not present

## 2023-07-13 DIAGNOSIS — M79675 Pain in left toe(s): Secondary | ICD-10-CM | POA: Diagnosis not present

## 2023-07-13 DIAGNOSIS — M79674 Pain in right toe(s): Secondary | ICD-10-CM

## 2023-07-13 NOTE — Progress Notes (Signed)
  Subjective:  Patient ID: Maria Fisher, female    DOB: Jan 19, 1956,  MRN: [id number]  Maria Fisher presents to clinic today for preventative diabetic foot care and painful thick toenails that are difficult to trim. Pain interferes with ambulation. Aggravating factors include wearing enclosed shoe gear. Pain is relieved with periodic professional debridement.  Chief Complaint  Patient presents with   [facility]    [facility]- Nails trimmed   New problem(s): None.   PCP is Aliene Beams, MD.  Allergies  Allergen Reactions   Dapagliflozin Nausea And Vomiting    Review of Systems: Negative except as noted in the HPI.  Objective: No changes noted in today's physical examination. There were no vitals filed for this visit.  Maria Fisher is a pleasant 67 y.o. female obese in NAD. AAO x 3.  Vascular Examination: Vascular status intact b/l with palpable pedal pulses. Pedal hair present b/l. CFT immediate b/l. No edema. No pain with calf compression b/l. Skin temperature gradient WNL b/l.   Neurological Examination: Sensation grossly intact b/l with 10 gram monofilament. Vibratory sensation intact b/l.   Dermatological Examination: Pedal skin with normal turgor, texture and tone b/l. Toenails 1-5 b/l thick, discolored, elongated with subungual debris and pain on dorsal palpation. No hyperkeratotic lesions noted b/l.   Musculoskeletal Examination: Muscle strength 5/5 to b/l LE. No pain, crepitus or joint limitation noted with ROM bilateral LE. Pes planus deformity noted bilateral LE.  Radiographs: None  Assessment/Plan: No diagnosis found.   No orders of the defined types were placed in this encounter.   -Consent given for treatment as described below: -Examined patient. -Patient to continue soft, supportive shoe gear daily. -Mycotic toenails 1-5 bilaterally were debrided in length and girth with sterile nail nippers and dremel without incident. -Patient/POA to call should there be  question/concern in the interim.   No follow-ups on file.  Candelaria Stagers, DPM

## 2023-08-17 DIAGNOSIS — B349 Viral infection, unspecified: Secondary | ICD-10-CM | POA: Diagnosis not present

## 2023-08-17 DIAGNOSIS — Z20822 Contact with and (suspected) exposure to covid-19: Secondary | ICD-10-CM | POA: Diagnosis not present

## 2023-09-10 DIAGNOSIS — I10 Essential (primary) hypertension: Secondary | ICD-10-CM | POA: Diagnosis not present

## 2023-09-10 DIAGNOSIS — M25562 Pain in left knee: Secondary | ICD-10-CM | POA: Diagnosis not present

## 2023-09-12 DIAGNOSIS — I10 Essential (primary) hypertension: Secondary | ICD-10-CM | POA: Diagnosis not present

## 2023-09-12 DIAGNOSIS — M25562 Pain in left knee: Secondary | ICD-10-CM | POA: Diagnosis not present

## 2023-09-12 DIAGNOSIS — E1165 Type 2 diabetes mellitus with hyperglycemia: Secondary | ICD-10-CM | POA: Diagnosis not present

## 2023-11-14 ENCOUNTER — Ambulatory Visit: Payer: Medicare Other | Admitting: Podiatry

## 2023-11-14 ENCOUNTER — Encounter: Payer: Self-pay | Admitting: Podiatry

## 2023-11-14 VITALS — Ht 59.0 in | Wt 195.0 lb

## 2023-11-14 DIAGNOSIS — M79675 Pain in left toe(s): Secondary | ICD-10-CM

## 2023-11-14 DIAGNOSIS — M79674 Pain in right toe(s): Secondary | ICD-10-CM | POA: Diagnosis not present

## 2023-11-14 DIAGNOSIS — E0590 Thyrotoxicosis, unspecified without thyrotoxic crisis or storm: Secondary | ICD-10-CM | POA: Diagnosis not present

## 2023-11-14 DIAGNOSIS — M2141 Flat foot [pes planus] (acquired), right foot: Secondary | ICD-10-CM

## 2023-11-14 DIAGNOSIS — M2142 Flat foot [pes planus] (acquired), left foot: Secondary | ICD-10-CM

## 2023-11-14 DIAGNOSIS — B351 Tinea unguium: Secondary | ICD-10-CM

## 2023-11-14 DIAGNOSIS — K7460 Unspecified cirrhosis of liver: Secondary | ICD-10-CM | POA: Diagnosis not present

## 2023-11-14 DIAGNOSIS — E119 Type 2 diabetes mellitus without complications: Secondary | ICD-10-CM | POA: Diagnosis not present

## 2023-11-14 DIAGNOSIS — E042 Nontoxic multinodular goiter: Secondary | ICD-10-CM | POA: Diagnosis not present

## 2023-11-14 DIAGNOSIS — N1831 Chronic kidney disease, stage 3a: Secondary | ICD-10-CM | POA: Diagnosis not present

## 2023-11-14 DIAGNOSIS — I10 Essential (primary) hypertension: Secondary | ICD-10-CM | POA: Diagnosis not present

## 2023-11-14 DIAGNOSIS — E559 Vitamin D deficiency, unspecified: Secondary | ICD-10-CM | POA: Diagnosis not present

## 2023-11-14 DIAGNOSIS — E1165 Type 2 diabetes mellitus with hyperglycemia: Secondary | ICD-10-CM | POA: Diagnosis not present

## 2023-11-14 NOTE — Progress Notes (Signed)
 ANNUAL DIABETIC FOOT EXAM  Subjective: Maria Fisher presents today for annual diabetic foot exam.  Chief Complaint  Patient presents with   Nail Problem    Pt is here for [facility] A1C was 7.6 PCP is Dr Tracie Harrier and LOV was in October.   Patient confirms h/o diabetes.  Patient denies any h/o foot wounds.  Aliene Beams, MD is patient's PCP.  Past Medical History:  Diagnosis Date   Back pain    Diabetes mellitus without complication (HCC)    Heart murmur    Hyperlipidemia    Hypertension    Neck pain    Neuromuscular disorder (HCC)    Shoulder pain, bilateral    Thyroid nodule    Patient Active Problem List   Diagnosis Date Noted   Systolic murmur 01/08/2023   Palpitations 01/08/2023   Encounter for screening colonoscopy 01/07/2018   Abnormal LFTs 01/07/2018   Nausea 11/20/2017   Dyspepsia 11/20/2017   Type 2 diabetes mellitus without complication, without long-term current use of insulin (HCC) 08/01/2017   HLD (hyperlipidemia) 07/03/2017   Type 2 diabetes mellitus with hyperglycemia (HCC) 06/12/2017   HTN, goal below 130/80 06/12/2017   CTS (carpal tunnel syndrome) 02/14/2017   Neck pain 01/22/2017   Hand weakness 01/22/2017   Low back pain 01/22/2017   Past Surgical History:  Procedure Laterality Date   ABDOMINAL HYSTERECTOMY     BIOPSY  03/14/2022   Procedure: BIOPSY;  Surgeon: Kerin Salen, MD;  Location: [facility];  Service: Gastroenterology;;   CESAREAN SECTION     x 2   CHOLECYSTECTOMY     COLONOSCOPY WITH PROPOFOL N/A 03/14/2022   Procedure: COLONOSCOPY WITH PROPOFOL;  Surgeon: Kerin Salen, MD;  Location: [facility];  Service: Gastroenterology;  Laterality: N/A;   ESOPHAGOGASTRODUODENOSCOPY (EGD) WITH PROPOFOL N/A 03/14/2022   Procedure: ESOPHAGOGASTRODUODENOSCOPY (EGD) WITH PROPOFOL;  Surgeon: Kerin Salen, MD;  Location: [facility];  Service: Gastroenterology;  Laterality: N/A;   POLYPECTOMY  03/14/2022   Procedure: POLYPECTOMY;  Surgeon: Kerin Salen, MD;  Location: [facility];  Service: Gastroenterology;;   Current Outpatient Medications on File Prior to Visit  Medication Sig Dispense Refill   aspirin EC 81 MG tablet Take 81 mg by mouth in the morning.     atorvastatin (LIPITOR) 40 MG tablet Take 40 mg by mouth at bedtime.     azaTHIOprine (IMURAN) 50 MG tablet Take 50 mg by mouth in the morning.     blood glucose meter kit and supplies KIT Dispense based on patient and insurance preference.lancets to fit device. 1 each 0   Cholecalciferol (VITAMIN D3) 50 MCG (2000 UT) capsule      cyanocobalamin (VITAMIN B12) 500 MCG tablet      glipiZIDE (GLUCOTROL XL) 5 MG 24 hr tablet Take 5 mg by mouth every morning.     glucose blood (CONTOUR NEXT TEST) test strip 1 each by Other route 2 (two) times daily. Use as instructed 100 each 3   lisinopril-hydrochlorothiazide (ZESTORETIC) 10-12.5 MG tablet Take 1 tablet by mouth daily.     metFORMIN (GLUCOPHAGE) 1000 MG tablet Take 1 tablet (1,000 mg total) by mouth 2 (two) times daily with a meal. 180 tablet 0   OZEMPIC, 0.25 OR 0.5 MG/DOSE, 2 MG/3ML SOPN Inject 0.5 mg into the skin every Tuesday.     traZODone (DESYREL) 100 MG tablet Take 100 mg by mouth at bedtime as needed for sleep.     Vitamin D, Ergocalciferol, (DRISDOL) 1.25 MG (50000 UNIT) CAPS  capsule Take 50,000 Units by mouth once a week.     No current facility-administered medications on file prior to visit.    Allergies  Allergen Reactions   Dapagliflozin Nausea And Vomiting   Social History   Occupational History   Occupation: Child care  Tobacco Use   Smoking status: Never   Smokeless tobacco: Never  Vaping Use   Vaping status: Never Used  Substance and Sexual Activity   Alcohol use: No   Drug use: No   Sexual activity: Not Currently    Partners: Male    Birth control/protection: None   Family History  Problem Relation Age of Onset   Diabetes Mother    Heart disease Mother    Kidney disease Mother    Depression  Mother    Hypertension Mother    Hyperlipidemia Mother    Cirrhosis Father    Alcohol abuse Father    Diabetes Sister    Hypertension Sister    Hypertension Brother    Hypertension Daughter    Hypertension Son    Colon cancer Neg Hx    Immunization History  Administered Date(s) Administered   Influenza,inj,Quad PF,6+ Mos 06/12/2017, 05/27/2018   Pneumococcal Conjugate-13 11/20/2017   Pneumococcal Polysaccharide-23 05/27/2018     Review of Systems: Negative except as noted in the HPI.   Objective: There were no vitals filed for this visit.  Maria Fisher is a pleasant 68 y.o. female in NAD. AAO X 3.  Diabetic foot exam was performed with the following findings:   Vascular Examination: Capillary refill time immediate b/l. Vascular status intact b/l with palpable pedal pulses. Pedal hair present b/l. No pain with calf compression b/l. Skin temperature gradient WNL b/l. No cyanosis or clubbing b/l. No ischemia or gangrene noted b/l.   Neurological Examination: Sensation grossly intact b/l with 10 gram monofilament. Vibratory sensation intact b/l.   Dermatological Examination: Pedal skin with normal turgor, texture and tone b/l.  No open wounds. No interdigital macerations.   Toenails 1-5 b/l thick, discolored, elongated with subungual debris and pain on dorsal palpation.   No corns, calluses nor porokeratotic lesions noted.  Musculoskeletal Examination: Normal muscle strength 5/5 to all lower extremity muscle groups bilaterally. Pes planus deformity noted bilateral LE.Marland Kitchen No pain, crepitus or joint limitation noted with ROM b/l LE.  Patient ambulates independently without assistive aids.  Radiographs: None     Lab Results  Component Value Date   HGBA1C 7.0 (H) 04/10/2018   ADA Risk Categorization: Low Risk :  Patient has all of the following: Intact protective sensation No prior foot ulcer  No severe deformity Pedal pulses present  Assessment: 1. Pain due to  onychomycosis of toenails of both feet   2. Pes planus of both feet   3. Type 2 diabetes mellitus without complication, without long-term current use of insulin (HCC)   4. Encounter for comprehensive diabetic foot examination, type 2 diabetes mellitus (HCC)     Plan: Patient was evaluated and treated. All patient's and/or POA's questions/concerns addressed on today's visit. Mycotic toenails 1-5 debrided in length and girth without incident. Continue soft, supportive shoe gear daily. Report any pedal injuries to medical professional. Call office if there are any quesitons/concerns. -Patient/POA to call should there be question/concern in the interim. No follow-ups on file.  Freddie Breech, DPM      [place] LOCATION: [address]  [place], [place] [address]                   Office [phone]   [facility] LOCATION: [address] [place], [place] [address] Office [phone]

## 2024-02-14 ENCOUNTER — Ambulatory Visit: Admitting: Podiatry

## 2024-02-15 ENCOUNTER — Ambulatory Visit: Admitting: Podiatry

## 2024-02-15 DIAGNOSIS — M79674 Pain in right toe(s): Secondary | ICD-10-CM | POA: Diagnosis not present

## 2024-02-15 DIAGNOSIS — B351 Tinea unguium: Secondary | ICD-10-CM

## 2024-02-15 DIAGNOSIS — M79675 Pain in left toe(s): Secondary | ICD-10-CM

## 2024-02-15 DIAGNOSIS — E119 Type 2 diabetes mellitus without complications: Secondary | ICD-10-CM | POA: Diagnosis not present

## 2024-02-20 DIAGNOSIS — I10 Essential (primary) hypertension: Secondary | ICD-10-CM | POA: Diagnosis not present

## 2024-02-20 DIAGNOSIS — E559 Vitamin D deficiency, unspecified: Secondary | ICD-10-CM | POA: Diagnosis not present

## 2024-02-20 DIAGNOSIS — E1165 Type 2 diabetes mellitus with hyperglycemia: Secondary | ICD-10-CM | POA: Diagnosis not present

## 2024-02-20 DIAGNOSIS — K7460 Unspecified cirrhosis of liver: Secondary | ICD-10-CM | POA: Diagnosis not present

## 2024-02-20 DIAGNOSIS — E0590 Thyrotoxicosis, unspecified without thyrotoxic crisis or storm: Secondary | ICD-10-CM | POA: Diagnosis not present

## 2024-02-20 DIAGNOSIS — E782 Mixed hyperlipidemia: Secondary | ICD-10-CM | POA: Diagnosis not present

## 2024-02-20 DIAGNOSIS — E042 Nontoxic multinodular goiter: Secondary | ICD-10-CM | POA: Diagnosis not present

## 2024-02-21 ENCOUNTER — Encounter: Payer: Self-pay | Admitting: Podiatry

## 2024-02-21 NOTE — Progress Notes (Signed)
  Subjective:  Patient ID: Maria Fisher, female    DOB: [birth year],  MRN: [id number]  Maria Fisher presents to clinic today for: preventative diabetic foot care and painful thick toenails that are difficult to trim. Pain interferes with ambulation. Aggravating factors include wearing enclosed shoe gear. Pain is relieved with periodic professional debridement.   PCP is Dorena Gander, MD. Maria Fisher 01/21/2024.  Allergies  Allergen Reactions   Dapagliflozin Nausea And Vomiting   Prednisone      Other Reaction(s): vomiting    Review of Systems: Negative except as noted in the HPI.  Objective: No changes noted in today's physical examination. There were no vitals filed for this visit.  Maria Fisher is a pleasant 68 y.o. female in NAD. AAO x 3.  Vascular Examination: Capillary refill time <3 seconds b/l LE. Palpable pedal pulses b/l LE. Digital hair present b/l. No pedal edema b/l. Skin temperature gradient WNL b/l. No varicosities b/l. No edema noted b/l LE. No cyanosis or clubbing noted b/l LE.Maria Fisher  Dermatological Examination: Pedal skin with normal turgor, texture and tone b/l. No open wounds. No interdigital macerations b/l. Toenails 1-5 b/l thickened, discolored, dystrophic with subungual debris. There is pain on palpation to dorsal aspect of nailplates. No hyperkeratotic nor porokeratotic lesions present on today's visit.Maria Fisher  Neurological Examination: Protective sensation intact with 10 gram monofilament b/l LE. Vibratory sensation intact b/l LE.   Musculoskeletal Examination: Muscle strength 5/5 to all lower extremity muscle groups bilaterally. Pes planus deformity noted bilateral LE.Maria Fisher No pain, crepitus or joint limitation noted with ROM b/l LE.  Patient ambulates independently without assistive aids.  Assessment/Plan: 1. Pain due to onychomycosis of toenails of both feet   2. Type 2 diabetes mellitus without complication, without long-term current use of insulin  [facility])     Consent  given for treatment. Patient examined. All patient's and/or POA's questions/concerns addressed on today's visit. Toenails 1-5 debrided in length and girth without incident. Continue foot and shoe inspections daily. Monitor blood glucose per PCP/Endocrinologist's recommendations. Continue soft, supportive shoe gear daily. Report any pedal injuries to medical professional. Call office if there are any questions/concerns. -Patient/POA to call should there be question/concern in the interim.   Return in about 3 months (around 05/17/2024).  Maria Fisher, DPM      [place] LOCATION: [address] [place] [address]                   Office [phone]   [facility] LOCATION: [address] [place], [place] [address] Office [phone]

## 2024-05-23 ENCOUNTER — Ambulatory Visit: Admitting: Podiatry

## 2024-05-23 DIAGNOSIS — Z91199 Patient's noncompliance with other medical treatment and regimen due to unspecified reason: Secondary | ICD-10-CM

## 2024-05-23 NOTE — Progress Notes (Signed)
 1. No-show for appointment

## 2024-06-03 DIAGNOSIS — I10 Essential (primary) hypertension: Secondary | ICD-10-CM | POA: Diagnosis not present

## 2024-06-03 DIAGNOSIS — M799 Soft tissue disorder, unspecified: Secondary | ICD-10-CM | POA: Diagnosis not present

## 2024-06-03 DIAGNOSIS — E559 Vitamin D deficiency, unspecified: Secondary | ICD-10-CM | POA: Diagnosis not present

## 2024-06-03 DIAGNOSIS — K7460 Unspecified cirrhosis of liver: Secondary | ICD-10-CM | POA: Diagnosis not present

## 2024-06-03 DIAGNOSIS — E118 Type 2 diabetes mellitus with unspecified complications: Secondary | ICD-10-CM | POA: Diagnosis not present

## 2024-06-03 DIAGNOSIS — E782 Mixed hyperlipidemia: Secondary | ICD-10-CM | POA: Diagnosis not present

## 2024-06-03 DIAGNOSIS — E0590 Thyrotoxicosis, unspecified without thyrotoxic crisis or storm: Secondary | ICD-10-CM | POA: Diagnosis not present

## 2024-06-03 DIAGNOSIS — N1831 Chronic kidney disease, stage 3a: Secondary | ICD-10-CM | POA: Diagnosis not present

## 2024-06-17 ENCOUNTER — Other Ambulatory Visit (HOSPITAL_COMMUNITY): Payer: Self-pay | Admitting: Family Medicine

## 2024-06-17 DIAGNOSIS — K7460 Unspecified cirrhosis of liver: Secondary | ICD-10-CM | POA: Diagnosis not present

## 2024-06-17 DIAGNOSIS — Z23 Encounter for immunization: Secondary | ICD-10-CM | POA: Diagnosis not present

## 2024-06-17 DIAGNOSIS — E0590 Thyrotoxicosis, unspecified without thyrotoxic crisis or storm: Secondary | ICD-10-CM | POA: Diagnosis not present

## 2024-06-17 DIAGNOSIS — Z1231 Encounter for screening mammogram for malignant neoplasm of breast: Secondary | ICD-10-CM | POA: Diagnosis not present

## 2024-06-17 DIAGNOSIS — E118 Type 2 diabetes mellitus with unspecified complications: Secondary | ICD-10-CM | POA: Diagnosis not present

## 2024-06-17 DIAGNOSIS — E538 Deficiency of other specified B group vitamins: Secondary | ICD-10-CM | POA: Diagnosis not present

## 2024-06-17 DIAGNOSIS — E782 Mixed hyperlipidemia: Secondary | ICD-10-CM | POA: Diagnosis not present

## 2024-06-17 DIAGNOSIS — I10 Essential (primary) hypertension: Secondary | ICD-10-CM | POA: Diagnosis not present

## 2024-06-18 DIAGNOSIS — E119 Type 2 diabetes mellitus without complications: Secondary | ICD-10-CM | POA: Diagnosis not present

## 2024-07-14 ENCOUNTER — Ambulatory Visit (INDEPENDENT_AMBULATORY_CARE_PROVIDER_SITE_OTHER): Admitting: Podiatry

## 2024-07-14 DIAGNOSIS — Z91199 Patient's noncompliance with other medical treatment and regimen due to unspecified reason: Secondary | ICD-10-CM

## 2024-07-14 DIAGNOSIS — Z91198 Patient's noncompliance with other medical treatment and regimen for other reason: Secondary | ICD-10-CM

## 2024-07-14 NOTE — Progress Notes (Addendum)
 1. Failure to attend appointment with reason given   Appointment rescheduled.

## 2024-07-17 ENCOUNTER — Ambulatory Visit

## 2024-07-17 DIAGNOSIS — Z0189 Encounter for other specified special examinations: Secondary | ICD-10-CM

## 2024-07-17 DIAGNOSIS — B351 Tinea unguium: Secondary | ICD-10-CM

## 2024-07-17 DIAGNOSIS — M2141 Flat foot [pes planus] (acquired), right foot: Secondary | ICD-10-CM

## 2024-07-17 DIAGNOSIS — M79675 Pain in left toe(s): Secondary | ICD-10-CM | POA: Diagnosis not present

## 2024-07-17 DIAGNOSIS — M79674 Pain in right toe(s): Secondary | ICD-10-CM

## 2024-07-17 DIAGNOSIS — M2142 Flat foot [pes planus] (acquired), left foot: Secondary | ICD-10-CM

## 2024-07-17 DIAGNOSIS — E119 Type 2 diabetes mellitus without complications: Secondary | ICD-10-CM | POA: Diagnosis not present

## 2024-07-17 NOTE — Progress Notes (Signed)
  Subjective:  Patient ID: Maria Fisher, female    DOB: [birth year],  MRN: [id number]  Maria Fisher presents to clinic today for: preventative diabetic foot care and painful thick toenails that are difficult to trim. Pain interferes with ambulation. Aggravating factors include wearing enclosed shoe gear. Pain is relieved with periodic professional debridement.   PCP is Rolinda Millman, MD. Maria Fisher 01/21/2024.  Allergies  Allergen Reactions   Dapagliflozin Nausea And Vomiting   Prednisone      Other Reaction(s): vomiting    Review of Systems: Negative except as noted in the HPI.  Objective: No changes noted in today's physical examination. There were no vitals filed for this visit.  Maria Fisher is a pleasant 68 y.o. female in NAD. AAO x 3.  Vascular Examination: Capillary refill time <3 seconds b/l LE. Palpable pedal pulses b/l LE. Digital hair present b/l. No pedal edema b/l. Skin temperature gradient WNL b/l. No varicosities b/l. No edema noted b/l LE. No cyanosis or clubbing noted b/l LE.SABRA  Dermatological Examination: Pedal skin with normal turgor, texture and tone b/l. No open wounds. No interdigital macerations b/l. Toenails 1-5 b/l thickened, discolored, dystrophic with subungual debris. There is pain on palpation to dorsal aspect of nailplates. No hyperkeratotic nor porokeratotic lesions present on today's visit.SABRA  Neurological Examination: Protective sensation intact with 10 gram monofilament b/l LE. Vibratory sensation intact b/l LE.   Musculoskeletal Examination: Muscle strength 5/5 to all lower extremity muscle groups bilaterally. Pes planus deformity noted bilateral LE.SABRA No pain, crepitus or joint limitation noted with ROM b/l LE.  Patient ambulates independently without assistive aids.  Assessment/Plan: 1. Pain due to onychomycosis of toenails of both feet   2. Type 2 diabetes mellitus without complication, without long-term current use of insulin  (HCC)   3. Pes planus  of both feet   4. Encounter for comprehensive diabetic foot examination, type 2 diabetes mellitus (HCC)      Consent given for treatment. Patient examined. All patient's and/or POA's questions/concerns addressed on today's visit. Toenails 1-5 debrided in length and girth without incident. Continue foot and shoe inspections daily. Monitor blood glucose per PCP/Endocrinologist's recommendations. Continue soft, supportive shoe gear daily. Report any pedal injuries to medical professional. Call office if there are any questions/concerns. -Patient/POA to call should there be question/concern in the interim.   RTC 3 months with Dr. Gaynel, DPM  Prentice JINNY Ovens, DPM      [place] LOCATION: [address], [place] [address]                   Office [phone]   [facility] LOCATION: [address] [place], [place] [address] Office [phone]

## 2024-10-22 ENCOUNTER — Ambulatory Visit

## 2024-10-27 ENCOUNTER — Ambulatory Visit (HOSPITAL_COMMUNITY)
